# Patient Record
Sex: Male | Born: 1950
Health system: Southern US, Community
[De-identification: ages and names within clinical notes are randomized; demographics above are authoritative.]

## PROBLEM LIST (undated history)

## (undated) DIAGNOSIS — K200 Eosinophilic esophagitis: Secondary | ICD-10-CM

## (undated) DIAGNOSIS — K5790 Diverticulosis of intestine, part unspecified, without perforation or abscess without bleeding: Secondary | ICD-10-CM

## (undated) DIAGNOSIS — E119 Type 2 diabetes mellitus without complications: Secondary | ICD-10-CM

## (undated) DIAGNOSIS — K219 Gastro-esophageal reflux disease without esophagitis: Secondary | ICD-10-CM

## (undated) HISTORY — DX: Diverticulosis of intestine, part unspecified, without perforation or abscess without bleeding: K57.90

## (undated) HISTORY — PX: COLONOSCOPY: SHX174

## (undated) HISTORY — PX: JOINT REPLACEMENT: SHX530

## (undated) HISTORY — PX: ESOPHAGOGASTRODUODENOSCOPY: SHX1529

## (undated) HISTORY — DX: Gastro-esophageal reflux disease without esophagitis: K21.9

## (undated) HISTORY — DX: Eosinophilic esophagitis: K20.0

---

## 2001-05-26 ENCOUNTER — Encounter: Payer: Self-pay | Admitting: Family Medicine

## 2001-05-26 ENCOUNTER — Encounter: Admission: RE | Admit: 2001-05-26 | Discharge: 2001-05-26 | Payer: Self-pay | Admitting: Family Medicine

## 2008-07-17 ENCOUNTER — Ambulatory Visit (HOSPITAL_COMMUNITY): Admission: RE | Admit: 2008-07-17 | Discharge: 2008-07-17 | Payer: Self-pay | Admitting: Neurosurgery

## 2009-08-13 ENCOUNTER — Ambulatory Visit (HOSPITAL_BASED_OUTPATIENT_CLINIC_OR_DEPARTMENT_OTHER): Admission: RE | Admit: 2009-08-13 | Discharge: 2009-08-13 | Payer: Self-pay | Admitting: Orthopedic Surgery

## 2009-12-06 ENCOUNTER — Emergency Department (HOSPITAL_BASED_OUTPATIENT_CLINIC_OR_DEPARTMENT_OTHER): Admission: EM | Admit: 2009-12-06 | Discharge: 2009-12-06 | Payer: Self-pay | Admitting: Emergency Medicine

## 2010-05-05 ENCOUNTER — Encounter: Admission: RE | Admit: 2010-05-05 | Discharge: 2010-05-05 | Payer: Self-pay | Admitting: Occupational Medicine

## 2010-06-29 ENCOUNTER — Ambulatory Visit (HOSPITAL_COMMUNITY): Admission: RE | Admit: 2010-06-29 | Discharge: 2010-06-29 | Payer: Self-pay | Admitting: Neurosurgery

## 2010-10-04 HISTORY — PX: KNEE ARTHROSCOPY: SUR90

## 2010-10-25 ENCOUNTER — Encounter: Payer: Self-pay | Admitting: Neurosurgery

## 2010-12-28 LAB — B. BURGDORFI ANTIBODIES: B burgdorferi Ab IgG+IgM: 0.33 {ISR}

## 2011-01-06 LAB — POCT HEMOGLOBIN-HEMACUE: Hemoglobin: 16.1 g/dL (ref 13.0–17.0)

## 2011-07-12 ENCOUNTER — Ambulatory Visit (HOSPITAL_BASED_OUTPATIENT_CLINIC_OR_DEPARTMENT_OTHER)
Admission: RE | Admit: 2011-07-12 | Discharge: 2011-07-12 | Disposition: A | Discharge status: Home / Self Care | Payer: 59 | Source: Ambulatory Visit | Attending: Orthopedic Surgery | Admitting: Orthopedic Surgery

## 2011-07-12 DIAGNOSIS — M6530 Trigger finger, unspecified finger: Secondary | ICD-10-CM | POA: Insufficient documentation

## 2011-07-12 DIAGNOSIS — Z01812 Encounter for preprocedural laboratory examination: Secondary | ICD-10-CM | POA: Insufficient documentation

## 2011-07-13 LAB — POCT HEMOGLOBIN-HEMACUE: Hemoglobin: 14.8 g/dL (ref 13.0–17.0)

## 2011-07-13 NOTE — Op Note (Signed)
NAMEQUARTEZ, LAGOS NO.:  [id number]  MEDICAL RECORD NO.:  [id number]  LOCATION:                                 FACILITY:  PHYSICIAN:  Jones Broom, MD         DATE OF BIRTH:  DATE OF PROCEDURE:  07/12/2011 DATE OF DISCHARGE:                              OPERATIVE REPORT   PREOPERATIVE DIAGNOSIS:  Right ring trigger finger.  POSTOPERATIVE DIAGNOSIS:  Right ring trigger finger.  PROCEDURE PERFORMED:  Right trigger finger release (A1 pulley release.)  ATTENDING SURGEON:  Jones Broom, MD  ASSISTANT:  None.  ANESTHESIA:  GETA with local anesthesia.  COMPLICATIONS:  None.  DRAINS:  None.  SPECIMENS:  None.  ESTIMATED BLOOD LOSS:  Minimal.  TOURNIQUET TIME:  10 minutes at 250 mmHg.  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  He wished to have surgical management.  He understood the risks, benefits and alternatives of the procedure including but not limited to risk of bleeding, infection, damage to neurovascular structures.  He elected to go forward with surgery.  PROCEDURE:  The patient was identified in the preoperative holding area where I personally marked the operative site after verifying site, side, and procedure with the patient.  He was taken back to the operating room where general anesthesia was induced without complication with an LMA. The right upper extremity was prepped and draped in standard sterile fashion.  A nonsterile tourniquet was applied to the forearm.  The patient did receive 2 g IV Ancef prior to elevation of the tourniquet. After the appropriate time-out procedure, the limb was exsanguinated using an Esmarch dressing.  The tourniquet was elevated to 250 mmHg. Approximately 15-mm incision was made in oblique fashion  just distal to the distal flexion crease in line with the fourth ray.  Dissection was carried down through the subcutaneous tissue with longitudinal spreading to the flexor tendon sheath.  The nerves were retracted medial and lateral with blunt retractors.  The proximal edge of the A1 pulley was identified and the pulley was noted to be stenotic and tight.  An 11- blade was then used from proximal to distal to split the A1 pulley for the complete length of the pulley.  The underlying tendon was examined. There was slight thickening, but no discrete nodules or abnormalities. The A1 pulley was noted to be widely separated at the completion of the procedure, and the tendon moved smoothly without any triggering.  The wound was then copiously irrigated with normal saline and subsequently closed with 4-0 nylon in an interrupted fashion.  The wound was infiltrated with 10 mL of 0.5% Marcaine without epinephrine.  Sterile dressings were then applied and the tourniquet was let down for total tourniquet time of 10 minutes at 250 mmHg.  The patient was then allowed to awaken from general anesthesia, transferred to the stretcher and taken to recovery room in stable condition.  POSTOPERATIVE PLAN:  He will be discharged home today with his  family. He will follow up with me in about 10 days for suture removal.     Jones Broom, MD     JC/MEDQ  D:  07/12/2011  T:  07/13/2011  Job:  [id number]  Electronically Signed by Jones Broom  on 07/13/2011 09:30:57 AM

## 2014-05-31 ENCOUNTER — Encounter: Payer: Self-pay | Admitting: Internal Medicine

## 2014-07-30 ENCOUNTER — Telehealth: Payer: Self-pay | Admitting: *Deleted

## 2014-07-30 ENCOUNTER — Ambulatory Visit (AMBULATORY_SURGERY_CENTER): Payer: Self-pay | Admitting: *Deleted

## 2014-07-30 VITALS — Ht 71.0 in | Wt 201.2 lb

## 2014-07-30 DIAGNOSIS — Z1211 Encounter for screening for malignant neoplasm of colon: Secondary | ICD-10-CM

## 2014-07-30 NOTE — Progress Notes (Signed)
No allergies to eggs or soy. No problems with anesthesia.  Pt given Emmi instructions for colonoscopy  No oxygen use  No diet drug use  

## 2014-07-30 NOTE — Telephone Encounter (Signed)
Dr Carlean Purl: pt scheduled for directed screening colonoscopy 08/13/14.  During PV pt says that he is having problems with "food sticking when he swallows." He has had to make himself vomit to relieve this.   He denies reflux symptoms.  This happens about twice a month.  Pt is asking to have this evaluated.  He would like to keep colonoscopy appointment as scheduled and make OV with you when January schedule is available.  Is this ok with you?  Thanks, Juliann Pulse

## 2014-08-01 NOTE — Telephone Encounter (Signed)
It sounds like he needs an EGD instead of a colonoscopy Would he switch out the elective colonoscopy for the EGD and possible dilation 11/10  Cannot do both that day  I think we should investigate a problem before doing a screening  Alternatively could try to find a double slot sometime in next few weeks

## 2014-08-02 ENCOUNTER — Encounter: Payer: Self-pay | Admitting: *Deleted

## 2014-08-02 NOTE — Telephone Encounter (Signed)
Talked with pt: rescheduled for EGD/Colon Wednesday 12/2 at 3:00.  Updated prep instructions reviewed and mailed to pt.

## 2014-08-13 ENCOUNTER — Encounter: Payer: Self-pay | Admitting: Internal Medicine

## 2014-08-13 ENCOUNTER — Encounter: Payer: 59 | Admitting: Internal Medicine

## 2014-09-03 ENCOUNTER — Encounter: Payer: Self-pay | Admitting: *Deleted

## 2014-09-04 ENCOUNTER — Encounter: Payer: Self-pay | Admitting: Internal Medicine

## 2014-09-04 ENCOUNTER — Ambulatory Visit: Payer: 59 | Admitting: Family Medicine

## 2014-09-04 ENCOUNTER — Ambulatory Visit (AMBULATORY_SURGERY_CENTER): Payer: 59 | Admitting: Internal Medicine

## 2014-09-04 VITALS — BP 120/73 | HR 55 | Temp 98.0°F | Resp 17 | Ht 71.0 in | Wt 201.0 lb

## 2014-09-04 DIAGNOSIS — R1310 Dysphagia, unspecified: Secondary | ICD-10-CM

## 2014-09-04 DIAGNOSIS — Z1211 Encounter for screening for malignant neoplasm of colon: Secondary | ICD-10-CM

## 2014-09-04 MED ORDER — SODIUM CHLORIDE 0.9 % IV SOLN: 500.0000 mL | INTRAVENOUS | Status: DC

## 2014-09-04 NOTE — Op Note (Signed)
[place]  [address] [place], [address]   ENDOSCOPY PROCEDURE REPORT  PATIENT: Alex, Clark  MR#: [id number] BIRTHDATE: 1951-02-02 , 41  yrs. old GENDER: male ENDOSCOPIST: [name], MD, [facility] PROCEDURE DATE:  09/04/2014 PROCEDURE:  EGD w/ biopsy and Maloney dilation of esophagus ASA CLASS:     Class II INDICATIONS:  dysphagia. MEDICATIONS: Propofol 250 mg IV and Monitored anesthesia care TOPICAL ANESTHETIC: none  DESCRIPTION OF PROCEDURE: After the risks benefits and alternatives of the procedure were thoroughly explained, informed consent was obtained.  The LB [id number] endoscope was introduced through the mouth and advanced to the second portion of the duodenum , Without limitations.  The instrument was slowly withdrawn as the mucosa was fully examined.    1) Fissures and multiple subtle ring-like mucosa changes in entire esophagus - biopsied to see if eosinophilic esophagitis 2) otherwise normal EGD3) 54 Fr Maloney dilation completed with mild resistance and trace heme (after biopsies).  Retroflexed views revealed no abnormalities.     The scope was then withdrawn from the patient and the procedure completed.  COMPLICATIONS: There were no immediate complications.  ENDOSCOPIC IMPRESSION: 1) Fissures and multiple subtle ring-like mucosa changes in entire esophagus - biopsied to see if eosinophilic esophagitis 2) otherwise normal EGD 3) 54 Fr Maloney dilation completed with mild resistance and trace heme (after biopsies)  RECOMMENDATIONS: 1.  Clear liquids until 5 PM , then soft foods rest of day.  Resume prior diet tomorrow. 2.  Office will call with results   eSigned:  [name], MD, [facility] 09/04/2014 4:01 PM    CC: The Patient

## 2014-09-04 NOTE — Patient Instructions (Addendum)
I dilated the esophagus and took biopsies to see if we can understand the cause of your swallowing difficulty. We will call with results and plans.  No polyps in the colon. You do have a condition called diverticulosis - common and not usually a problem. Please read the handout provided.  Next routine colonoscopy in 10 years - 2025  I appreciate the opportunity to care for you. Alex Mayer, MD, [facility]  Diverticulosis (handout given) Repeat colonoscopy in 10 years.  Await biopsy results from Dr. Carlean Purl for endoscopy.  Dilation diet (handout given)  YOU HAD AN ENDOSCOPIC PROCEDURE TODAY AT [facility]: Refer to the procedure report that was given to you for any specific questions about what was found during the examination.  If the procedure report does not answer your questions, please call your gastroenterologist to clarify.  If you requested that your care partner not be given the details of your procedure findings, then the procedure report has been included in a sealed envelope for you to review at your convenience later.  YOU SHOULD EXPECT: Some feelings of bloating in the abdomen. Passage of more gas than usual.  Walking can help get rid of the air that was put into your GI tract during the procedure and reduce the bloating. If you had a lower endoscopy (such as a colonoscopy or flexible sigmoidoscopy) you may notice spotting of blood in your stool or on the toilet paper. If you underwent a bowel prep for your procedure, then you may not have a normal bowel movement for a few days.  DIET: Your first meal following the procedure should be a light meal and then it is ok to progress to your normal diet.  A half-sandwich or bowl of soup is an example of a good first meal.  Heavy or fried foods are harder to digest and may make you feel nauseous or bloated.  Likewise meals heavy in dairy and vegetables can cause extra gas to form and this can also increase the  bloating.  Drink plenty of fluids but you should avoid alcoholic beverages for 24 hours.  ACTIVITY: Your care partner should take you home directly after the procedure.  You should plan to take it easy, moving slowly for the rest of the day.  You can resume normal activity the day after the procedure however you should NOT DRIVE or use heavy machinery for 24 hours (because of the sedation medicines used during the test).    SYMPTOMS TO REPORT IMMEDIATELY: A gastroenterologist can be reached at any hour.  During normal business hours, 8:30 AM to 5:00 PM Monday through Friday, call [phone].  After hours and on weekends, please call the GI answering service at [phone] who will take a message and have the physician on call contact you.   Following lower endoscopy (colonoscopy or flexible sigmoidoscopy):  Excessive amounts of blood in the stool  Significant tenderness or worsening of abdominal pains  Swelling of the abdomen that is new, acute  Fever of 100F or higher  Following upper endoscopy (EGD)  Vomiting of blood or coffee ground material  New chest pain or pain under the shoulder blades  Painful or persistently difficult swallowing  New shortness of breath  Fever of 100F or higher  Black, tarry-looking stools  FOLLOW UP: If any biopsies were taken you will be contacted by phone or by letter within the next 1-3 weeks.  Call your gastroenterologist if you have not heard about  the biopsies in 3 weeks.  Our staff will call the home number listed on your records the next business day following your procedure to check on you and address any questions or concerns that you may have at that time regarding the information given to you following your procedure. This is a courtesy call and so if there is no answer at the home number and we have not heard from you through the emergency physician on call, we will assume that you have returned to your regular daily activities without  incident.  SIGNATURES/CONFIDENTIALITY: You and/or your care partner have signed paperwork which will be entered into your electronic medical record.  These signatures attest to the fact that that the information above on your After Visit Summary has been reviewed and is understood.  Full responsibility of the confidentiality of this discharge information lies with you and/or your care-partner.

## 2014-09-04 NOTE — Progress Notes (Signed)
Report to PACU, RN, vss, BBS= Clear.  

## 2014-09-04 NOTE — Progress Notes (Signed)
Called to room to assist during endoscopic procedure.  Patient ID and intended procedure confirmed with present staff. Received instructions for my participation in the procedure from the performing physician.  

## 2014-09-04 NOTE — Op Note (Signed)
[place]  [address]. [place], [address]   COLONOSCOPY PROCEDURE REPORT  PATIENT: Alex Clark, Alex Clark  MR#: [id number] BIRTHDATE: 01/31/1951 , 67  yrs. old GENDER: male ENDOSCOPIST: [name], MD, [facility] PROCEDURE DATE:  09/04/2014 PROCEDURE:   Colonoscopy, screening First Screening Colonoscopy - Avg.  risk and is 50 yrs.  old or older Yes.  Prior Negative Screening - Now for repeat screening. N/A  History of Adenoma - Now for follow-up colonoscopy & has been > or = to 3 yrs.  N/A  Polyps Removed Today? No.  Polyps Removed Today? No.  Recommend repeat exam, <10 yrs? Polyps Removed Today? No.  Recommend repeat exam, <10 yrs? No. ASA CLASS:   Class II INDICATIONS:average risk for colorectal cancer and first colonoscopy. MEDICATIONS: Residual sedation present, Propofol 50 mg IV, and Monitored anesthesia care  DESCRIPTION OF PROCEDURE:   After the risks benefits and alternatives of the procedure were thoroughly explained, informed consent was obtained.  The digital rectal exam revealed no abnormalities of the rectum, revealed no prostatic nodules, and revealed the prostate was not enlarged.   The LB [id number] endoscope was introduced through the anus and advanced to the cecum, which was identified by both the appendix and ileocecal valve. No adverse events experienced.   The quality of the prep was good, using MiraLax  The instrument was then slowly withdrawn as the colon was fully examined.      COLON FINDINGS: There was mild diverticulosis noted in the sigmoid colon.   The examination was otherwise normal.  Retroflexed views revealed no abnormalities. The time to cecum=3 minutes 09 seconds. Withdrawal time=9 minutes 34 seconds.  The scope was withdrawn and the procedure completed. COMPLICATIONS: There were no immediate complications.  ENDOSCOPIC IMPRESSION: 1.   Mild diverticulosis was noted in the sigmoid colon 2.   The examination was  otherwise normal - good prep - first screening  RECOMMENDATIONS: Repeat colonoscopy 10 years.  eSigned:  [name], MD, [facility] 09/04/2014 4:04 PM   cc: The Patient

## 2014-09-05 ENCOUNTER — Telehealth: Payer: Self-pay | Admitting: *Deleted

## 2014-09-05 NOTE — Telephone Encounter (Signed)
  Follow up Call-  Call back number 09/04/2014  Post procedure Call Back phone  # [phone]  Permission to leave phone message Yes     Patient questions:  Message left to call us if necessary.

## 2014-09-09 NOTE — Progress Notes (Signed)
Quick Note:  Office - let him know he has eosinophilic esophagitis and ask how he is doing after the dilation I want him to start pantoprazole 40 mg bid before breakfast and supper # 60 3 RF Needs REV Feb ______

## 2014-09-10 ENCOUNTER — Other Ambulatory Visit: Payer: Self-pay

## 2014-09-10 MED ORDER — PANTOPRAZOLE SODIUM 40 MG PO TBEC: 40.0000 mg | DELAYED_RELEASE_TABLET | Freq: Two times a day (BID) | ORAL | Status: DC

## 2014-09-19 ENCOUNTER — Encounter: Payer: Self-pay | Admitting: Family Medicine

## 2014-09-19 ENCOUNTER — Ambulatory Visit (INDEPENDENT_AMBULATORY_CARE_PROVIDER_SITE_OTHER): Payer: 59 | Admitting: Family Medicine

## 2014-09-19 VITALS — BP 112/60 | HR 68 | Temp 98.2°F | Ht 68.75 in | Wt 202.8 lb

## 2014-09-19 DIAGNOSIS — M25552 Pain in left hip: Secondary | ICD-10-CM

## 2014-09-19 DIAGNOSIS — M25551 Pain in right hip: Secondary | ICD-10-CM | POA: Insufficient documentation

## 2014-09-19 DIAGNOSIS — K219 Gastro-esophageal reflux disease without esophagitis: Secondary | ICD-10-CM

## 2014-09-19 NOTE — Assessment & Plan Note (Signed)
With evidence of eosinophilic esophagitis. On PPI Followed by Dr. Carlean Purl- advised him to call GI to discuss headaches. The patient indicates understanding of these issues and agrees with the plan.

## 2014-09-19 NOTE — Patient Instructions (Addendum)
It was nice to meet you. I will review your records and call you if we need to add anything.  Check with your insurance to see if they will cover the shingles shot.  Call Dr. Carlean Purl about your headaches- [phone] ? If it is due to protonix.

## 2014-09-19 NOTE — Assessment & Plan Note (Signed)
Intermittent. Normal exam today. Probable OA and intermittent bursitis. Advised to use NSAIDs cautiously and tylenol prn. The patient indicates understanding of these issues and agrees with the plan.

## 2014-09-19 NOTE — Progress Notes (Signed)
Subjective:   Patient ID: Alex Clark, male    DOB: 04/14/1951, 63 y.o.   MRN: [id number]  Alex Clark is a pleasant 63 y.o. year old male who presents to clinic today with [place]  on 09/19/2014  HPI:  Very healthy- does not take many medications.  Previous PCP referred him to GI for dysphagia- saw Dr. Carlean Purl.  Endoscopy and Colonoscopy done on 09/04/14- neg colonoscopy.  Endoscopy with esophageal dilatation- biopsy showed eosinophilic esophagitis- started on Protonix 40 mg twice daily.  He has never had any "classic" symptoms of reflux. He thinks it is giving him headaches.  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  Per pt, just had lab work done- will await records.  Current Outpatient Prescriptions on File Prior to Visit  Medication Sig Dispense Refill  . ibuprofen (ADVIL) 200 MG tablet Take 200 mg by mouth every 6 (six) hours as needed.    . pantoprazole (PROTONIX) 40 MG tablet Take 1 tablet (40 mg total) by mouth 2 (two) times daily before a meal. 60 tablet 6   No current facility-administered medications on file prior to visit.    No Known Allergies  History reviewed. No pertinent past medical history.  Past Surgical History  Procedure Laterality Date  . Knee arthroscopy Left 2012    Family History  Problem Relation Age of Onset  . Colon cancer Neg Hx   . Heart disease Mother   . Hypertension Mother   . Hypertension Father   . Cancer Sister   . Cancer Maternal Uncle     History   Social History  . Marital Status: Single    Spouse Name: N/A    Number of Children: N/A  . Years of Education: N/A   Occupational History  . Not on file.   Social History Main Topics  . Smoking status: Never Smoker   . Smokeless tobacco: Never Used  . Alcohol Use: No  . Drug Use: No  . Sexual Activity: Yes   Other Topics Concern  . Not  on file   Social History Narrative   Archivist   Divorced- two children- son lives in [place], [place] an daughter lives in [place]- she has two sons.      The PMH, PSH, Social History, Family History, Medications, and allergies have been reviewed in [facility], and have been updated if relevant.   Review of Systems  Constitutional: Negative.   Respiratory: Negative.   Cardiovascular: Negative.   Gastrointestinal: Negative.   Endocrine: Negative.   Genitourinary: Negative.   Musculoskeletal: Negative for back pain, joint swelling and gait problem.  Allergic/Immunologic: Negative.   Neurological: Positive for headaches. Negative for dizziness and numbness.  Hematological: Negative.   Psychiatric/Behavioral: Negative.   All other systems reviewed and are negative.      Objective:    BP 112/60 mmHg  Pulse 68  Temp(Src) 98.2 F (36.8 C) (Oral)  Ht 5' 8.75" (1.746 m)  Wt 202 lb 12 oz (91.967 kg)  BMI 30.17 kg/m2  SpO2 96%   Physical Exam  Constitutional: He is oriented to person, place, and time. He appears well-developed and well-nourished. No distress.  HENT:  Head: Normocephalic and atraumatic.  Eyes: Pupils are equal, round, and reactive to light.  Neck: Normal range of motion.  Cardiovascular: Normal rate and regular rhythm.  Pulmonary/Chest: Effort normal and breath sounds normal.  Musculoskeletal: Normal range of motion.       Right hip: Normal.       Left hip: Normal.  Neurological: He is alert and oriented to person, place, and time.  Skin: Skin is warm and dry.  Psychiatric: He has a normal mood and affect. His behavior is normal. Judgment and thought content normal.  Nursing note and vitals reviewed.         Assessment & Plan:   Gastroesophageal reflux disease, esophagitis presence not specified  Hip pain, bilateral No Follow-up on file.

## 2014-09-19 NOTE — Progress Notes (Signed)
Pre visit review using our clinic review tool, if applicable. No additional management support is needed unless otherwise documented below in the visit note. 

## 2014-11-08 ENCOUNTER — Telehealth: Payer: Self-pay | Admitting: Internal Medicine

## 2014-11-08 NOTE — Telephone Encounter (Signed)
Patient notified of follow up for EE

## 2014-11-12 ENCOUNTER — Ambulatory Visit (INDEPENDENT_AMBULATORY_CARE_PROVIDER_SITE_OTHER): Payer: 59 | Admitting: Internal Medicine

## 2014-11-12 ENCOUNTER — Encounter: Payer: Self-pay | Admitting: Internal Medicine

## 2014-11-12 VITALS — BP 110/80 | HR 60 | Ht 68.75 in | Wt 211.2 lb

## 2014-11-12 DIAGNOSIS — K2100 Gastro-esophageal reflux disease with esophagitis, without bleeding: Secondary | ICD-10-CM

## 2014-11-12 DIAGNOSIS — G4441 Drug-induced headache, not elsewhere classified, intractable: Secondary | ICD-10-CM

## 2014-11-12 DIAGNOSIS — K200 Eosinophilic esophagitis: Secondary | ICD-10-CM

## 2014-11-12 DIAGNOSIS — G4440 Drug-induced headache, not elsewhere classified, not intractable: Secondary | ICD-10-CM

## 2014-11-12 MED ORDER — OMEPRAZOLE 40 MG PO CPDR: 40.0000 mg | DELAYED_RELEASE_CAPSULE | Freq: Every day | ORAL | Status: DC

## 2014-11-12 NOTE — Progress Notes (Signed)
   Subjective:    Patient ID: Alex Clark, male    DOB: March 17, 1951, 64 y.o.   MRN: [id number] Chief complaint: Follow-up of eosinophilic esophagitis HPI  The patient is here S after he was diagnosed with eosinophilic esophagitis at upper endoscopy in December. At that time I performed a 73 [place] dilation as well. Since then he is been treated with pantoprazole 40 mg twice a day. He has had no dysphagia since that time. He thinks he was having swallowing problems for 20 or more years. He has noticed a headache ever since starting the pantoprazole, almost every day. He recalls taking omeprazole or Prilosec many years ago for heartburn problems though was not chronic. That did not cause headaches. Medications, allergies, past medical history, past surgical history, family history and social history are reviewed and updated in the EMR.  Review of Systems As per history of present illness    Objective:   Physical Exam BP 110/80 mmHg  Pulse 60  Ht 5' 8.75" (1.746 m)  Wt 211 lb 3.2 oz (95.8 kg)  BMI 31.43 kg/m2 Well-developed well-nourished white man in no acute distress    Assessment & Plan:   1. Eosinophilic esophagitis   2. Gastroesophageal reflux disease with esophagitis   3. Drug induced headache    1. I reviewed the nature of eosinophilic esophagitis and the possibility of food allergies. A handout was provided to the patient. At this point we have decided to proceed with continued PPI therapy and follow-up in 1 year sooner if needed. 2. Changes PPI to omeprazole 40 mg daily, as we think pantoprazole caused headache.

## 2014-11-12 NOTE — Patient Instructions (Signed)
   Glad things are better. I hope the omeprazole does not cause a headache.  Let me know if swallowing problems return.  I appreciate the opportunity to care for you. [name], MD, [facility]

## 2016-03-15 ENCOUNTER — Ambulatory Visit (INDEPENDENT_AMBULATORY_CARE_PROVIDER_SITE_OTHER): Payer: Commercial Managed Care - HMO | Admitting: Adult Health

## 2016-03-15 ENCOUNTER — Ambulatory Visit: Payer: 59 | Admitting: Internal Medicine

## 2016-03-15 ENCOUNTER — Encounter: Payer: Self-pay | Admitting: Adult Health

## 2016-03-15 VITALS — BP 120/82 | Temp 98.2°F | Wt 204.9 lb

## 2016-03-15 DIAGNOSIS — L259 Unspecified contact dermatitis, unspecified cause: Secondary | ICD-10-CM | POA: Diagnosis not present

## 2016-03-15 MED ORDER — METHYLPREDNISOLONE ACETATE 80 MG/ML IJ SUSP
80.0000 mg | Freq: Once | INTRAMUSCULAR | Status: AC
  Administered 2016-03-15: 80 mg via INTRAMUSCULAR

## 2016-03-15 NOTE — Patient Instructions (Addendum)
It was great meeting you   Your rash is consistent with poison ivy.   Continue to use the calamine lotion as needed for itching.   You can also use oral benadryl at night or a benadryl cream  Follow up as needed

## 2016-03-15 NOTE — Progress Notes (Signed)
Subjective:    Patient ID: Alex Clark, male    DOB: 07-23-1951, 65 y.o.   MRN: [id number]  HPI 65 year old male who presents to the office today for a " poison ivy " type rash. He reports that he was working in the yard on Thursday when he became in contact with poison ivy.   He reports that he has a red itchy rash that has not started weeping yet. The rash is on bilateral legs. Denies rash elsewhere. Denies any signs of infection.      Review of Systems  Constitutional: Negative.   HENT: Negative.   Skin: Positive for color change and rash. Negative for pallor and wound.  All other systems reviewed and are negative.  Past Medical History  Diagnosis Date  . Diverticulosis   . Eosinophilic esophagitis   . GERD (gastroesophageal reflux disease)     Social History   Social History  . Marital Status: Single    Spouse Name: N/A  . Number of Children: N/A  . Years of Education: N/A   Occupational History  . Not on file.   Social History Main Topics  . Smoking status: Never Smoker   . Smokeless tobacco: Never Used  . Alcohol Use: No  . Drug Use: No  . Sexual Activity: Yes   Other Topics Concern  . 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       Past Surgical History  Procedure Laterality Date  . Knee arthroscopy Left 2012  . Colonoscopy    . Esophagogastroduodenoscopy      Family History  Problem Relation Age of Onset  . Colon cancer Neg Hx   . Heart disease Mother   . Hypertension Mother   . Hypertension Father   . Cancer Sister   . Cancer Maternal Uncle     Allergies  Allergen Reactions  . Pantoprazole Sodium Other (See Comments)    Headache    Current Outpatient Prescriptions on File Prior to Visit  Medication Sig Dispense Refill  . ibuprofen (ADVIL) 200 MG tablet Take 200 mg by mouth every 6 (six) hours as needed.    Marland Kitchen omeprazole  (PRILOSEC) 40 MG capsule Take 1 capsule (40 mg total) by mouth daily before breakfast. 90 capsule 3   No current facility-administered medications on file prior to visit.    BP 120/82 mmHg  Temp(Src) 98.2 F (36.8 C) (Oral)  Wt 204 lb 14.4 oz (92.942 kg)       Objective:   Physical Exam  Constitutional: He is oriented to person, place, and time. He appears well-developed and well-nourished. No distress.  Cardiovascular: Normal rate, regular rhythm, normal heart sounds and intact distal pulses.  Exam reveals no gallop and no friction rub.   No murmur heard. Pulmonary/Chest: Effort normal and breath sounds normal.  Musculoskeletal: Normal range of motion. He exhibits no edema or tenderness.  Neurological: He is alert and oriented to person, place, and time.  Skin: Skin is warm and dry. Rash noted. He is not diaphoretic. There is erythema. No pallor.  Red, vascular rash that is consistent with poison ivy on bilateral legs.   Psychiatric: He has a normal mood and affect. His behavior is normal. Judgment and thought content normal.      Assessment & Plan:  1. Contact dermatitis - methylPREDNISolone acetate (DEPO-MEDROL) injection  80 mg; Inject 1 mL (80 mg total) into the muscle once. - Calamine lotion - oral benadryl  - Follow up as needed  Dorothyann Peng, NP

## 2016-03-17 ENCOUNTER — Ambulatory Visit (INDEPENDENT_AMBULATORY_CARE_PROVIDER_SITE_OTHER): Payer: Commercial Managed Care - HMO | Admitting: Family Medicine

## 2016-03-17 ENCOUNTER — Encounter: Payer: Self-pay | Admitting: Family Medicine

## 2016-03-17 VITALS — BP 114/76 | HR 60 | Temp 98.7°F | Ht 68.75 in | Wt 205.2 lb

## 2016-03-17 DIAGNOSIS — L237 Allergic contact dermatitis due to plants, except food: Secondary | ICD-10-CM

## 2016-03-17 MED ORDER — OMEPRAZOLE 40 MG PO CPDR: 40.0000 mg | DELAYED_RELEASE_CAPSULE | Freq: Every day | ORAL | Status: DC

## 2016-03-17 MED ORDER — PREDNISONE 20 MG PO TABS: ORAL_TABLET | ORAL | Status: DC

## 2016-03-17 NOTE — Patient Instructions (Signed)
Prednisone taper. 3-3-3-3-2-2-2-2-1-1-1-1.  With food.  Repeat if needed.  Schedule a physical with Dr. Deborra Medina.  Take care.  Glad to see you.

## 2016-03-17 NOTE — Assessment & Plan Note (Signed)
Doesn't appear infected.  Steroid taper with routine cautions.  F/u prn.   Can repeat the taper if needed.  D/w pt.  He agrees.

## 2016-03-17 NOTE — Progress Notes (Signed)
Pre visit review using our clinic review tool, if applicable. No additional management support is needed unless otherwise documented below in the visit note.  Needs a PPI refill, done at [facility] with the plan for him to fu with PCP re CPE.   Poison oak exposure likely.  Itchy.  Using calamine lotion.  Used had to get a shot and a dose pack.  Injected Monday.  Still itches.  Spreading.  BLE rash.  No FCNAVD.    Meds, vitals, and allergies reviewed.   ROS: Per HPI unless specifically indicated in ROS section   nad Blanching irregular rash on the BLE.

## 2016-04-01 ENCOUNTER — Encounter: Payer: Commercial Managed Care - HMO | Admitting: Family Medicine

## 2016-04-05 ENCOUNTER — Encounter: Payer: Self-pay | Admitting: Family Medicine

## 2016-04-05 ENCOUNTER — Ambulatory Visit (INDEPENDENT_AMBULATORY_CARE_PROVIDER_SITE_OTHER): Payer: Commercial Managed Care - HMO | Admitting: Family Medicine

## 2016-04-05 VITALS — BP 114/78 | HR 55 | Temp 98.2°F | Ht 68.75 in | Wt 193.5 lb

## 2016-04-05 DIAGNOSIS — K2100 Gastro-esophageal reflux disease with esophagitis, without bleeding: Secondary | ICD-10-CM

## 2016-04-05 DIAGNOSIS — N528 Other male erectile dysfunction: Secondary | ICD-10-CM

## 2016-04-05 DIAGNOSIS — Z114 Encounter for screening for human immunodeficiency virus [HIV]: Secondary | ICD-10-CM

## 2016-04-05 DIAGNOSIS — R7989 Other specified abnormal findings of blood chemistry: Secondary | ICD-10-CM

## 2016-04-05 DIAGNOSIS — Z23 Encounter for immunization: Secondary | ICD-10-CM

## 2016-04-05 DIAGNOSIS — N529 Male erectile dysfunction, unspecified: Secondary | ICD-10-CM | POA: Insufficient documentation

## 2016-04-05 DIAGNOSIS — K200 Eosinophilic esophagitis: Secondary | ICD-10-CM

## 2016-04-05 DIAGNOSIS — Z1159 Encounter for screening for other viral diseases: Secondary | ICD-10-CM

## 2016-04-05 DIAGNOSIS — Z0000 Encounter for general adult medical examination without abnormal findings: Secondary | ICD-10-CM | POA: Insufficient documentation

## 2016-04-05 LAB — COMPREHENSIVE METABOLIC PANEL
ALBUMIN: 4.5 g/dL (ref 3.5–5.2)
ALT: 27 U/L (ref 0–53)
AST: 17 U/L (ref 0–37)
Alkaline Phosphatase: 62 U/L (ref 39–117)
BUN: 14 mg/dL (ref 6–23)
CHLORIDE: 103 meq/L (ref 96–112)
CO2: 30 mEq/L (ref 19–32)
CREATININE: 1.33 mg/dL (ref 0.40–1.50)
Calcium: 9.8 mg/dL (ref 8.4–10.5)
GFR: 57.81 mL/min — ABNORMAL LOW (ref >60.00)
GLUCOSE: 119 mg/dL — AB (ref 70–99)
Potassium: 5.3 mEq/L — ABNORMAL HIGH (ref 3.5–5.1)
SODIUM: 137 meq/L (ref 135–145)
Total Bilirubin: 0.9 mg/dL (ref 0.2–1.2)
Total Protein: 7.5 g/dL (ref 6.0–8.3)

## 2016-04-05 LAB — CBC WITH DIFFERENTIAL/PLATELET
BASOS PCT: 0.3 % (ref 0.0–3.0)
Basophils Absolute: 0 10*3/uL (ref 0.0–0.1)
EOS ABS: 0.2 10*3/uL (ref 0.0–0.7)
Eosinophils Relative: 1.6 % (ref 0.0–5.0)
HCT: 48.7 % (ref 39.0–52.0)
HEMOGLOBIN: 16.5 g/dL (ref 13.0–17.0)
Lymphocytes Relative: 20 % (ref 12.0–46.0)
Lymphs Abs: 2.4 10*3/uL (ref 0.7–4.0)
MCHC: 33.8 g/dL (ref 30.0–36.0)
MCV: 87.1 fl (ref 78.0–100.0)
MONO ABS: 0.9 10*3/uL (ref 0.1–1.0)
MONOS PCT: 8 % (ref 3.0–12.0)
Neutro Abs: 8.3 10*3/uL — ABNORMAL HIGH (ref 1.4–7.7)
Neutrophils Relative %: 70.1 % (ref 43.0–77.0)
Platelets: 214 10*3/uL (ref 150.0–400.0)
RBC: 5.59 Mil/uL (ref 4.22–5.81)
RDW: 13.9 % (ref 11.5–15.5)
WBC: 11.8 10*3/uL — ABNORMAL HIGH (ref 4.0–10.5)

## 2016-04-05 LAB — LIPID PANEL
CHOL/HDL RATIO: 5
CHOLESTEROL: 230 mg/dL — AB (ref 0–200)
HDL: 46.1 mg/dL (ref >39.00)
NONHDL: 184.14
Triglycerides: 217 mg/dL — ABNORMAL HIGH (ref 0.0–149.0)
VLDL: 43.4 mg/dL — ABNORMAL HIGH (ref 0.0–40.0)

## 2016-04-05 LAB — LDL CHOLESTEROL, DIRECT: LDL DIRECT: 166 mg/dL

## 2016-04-05 LAB — PSA: PSA: 1.68 ng/mL (ref 0.10–4.00)

## 2016-04-05 MED ORDER — SILDENAFIL CITRATE 25 MG PO TABS: 25.0000 mg | ORAL_TABLET | Freq: Every day | ORAL | Status: DC | PRN

## 2016-04-05 NOTE — Patient Instructions (Signed)
Great to see you. We will call you with your results from today. 

## 2016-04-05 NOTE — Assessment & Plan Note (Signed)
Discussed possible risks and side effects of viagra. Rx printed and given to pt.

## 2016-04-05 NOTE — Assessment & Plan Note (Signed)
Continue omeprazole. Followed by GI.

## 2016-04-05 NOTE — Addendum Note (Signed)
Addended by: Modena Nunnery on: 04/05/2016 11:25 AM   Modules accepted: Orders, SmartSet

## 2016-04-05 NOTE — Progress Notes (Signed)
Subjective:   Patient ID: Alex Clark, male    DOB: 11/11/1950, 65 y.o.   MRN: [id number]  Alex Clark is a pleasant 65 y.o. year old male who presents to clinic today with Annual Exam  on 04/05/2016  HPI:  Very healthy. I have not seen him since he established care with me in 09/2014.  GERD- Previous PCP referred him to GI for dysphagia- saw Dr. Carlean Purl.  Endoscopy and Colonoscopy done on 09/04/14- neg colonoscopy.  Endoscopy with esophageal dilatation- biopsy showed eosinophilic esophagitis- started on Protonix 40 mg twice daily.  He has never had any "classic" symptoms of reflux.  Saw Dr. Carlean Purl again on 11/12/14.  Note reviewed. Protonix dc'd as may have been contributing to headaches and started on Omeprazole 40 mg daily.  Has had no further headaches.  He is asking for viagra.  Used this in past.  Often has problems with "stamina" but is able to get an erection without difficulty.  No recent changes in sexual function. No results found for: CHOL, HDL, LDLCALC, LDLDIRECT, TRIG, CHOLHDL No results found for: CREATININE Lab Results  Component Value Date   HGB 14.8 07/12/2011   No results found for: TSH   Current Outpatient Prescriptions on File Prior to Visit  Medication Sig Dispense Refill  . ibuprofen (ADVIL) 200 MG tablet Take 200 mg by mouth every 6 (six) hours as needed.    Marland Kitchen omeprazole (PRILOSEC) 40 MG capsule Take 1 capsule (40 mg total) by mouth daily before breakfast. 90 capsule 0   No current facility-administered medications on file prior to visit.    Allergies  Allergen Reactions  . Pantoprazole Sodium Other (See Comments)    Headache    Past Medical History  Diagnosis Date  . Diverticulosis   . Eosinophilic esophagitis   . GERD (gastroesophageal reflux disease)     Past Surgical History  Procedure Laterality Date  . Knee arthroscopy Left 2012  . Colonoscopy    . Esophagogastroduodenoscopy      Family History  Problem Relation Age of Onset    . Colon cancer Neg Hx   . Heart disease Mother   . Hypertension Mother   . Hypertension Father   . Cancer Sister   . Cancer Maternal Uncle     Social History   Social History  . Marital Status: Single    Spouse Name: N/A  . Number of Children: N/A  . Years of Education: N/A   Occupational History  . Not on file.   Social History Main Topics  . Smoking status: Never Smoker   . Smokeless tobacco: Never Used  . Alcohol Use: No  . Drug Use: No  . Sexual Activity: Yes   Other Topics Concern  . 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      The PMH, PSH, Social History, Family History, Medications, and allergies have been reviewed in [facility], and have been updated if relevant.   Review of Systems  Constitutional: Negative.   Respiratory: Negative.   Cardiovascular: Negative.   Gastrointestinal: Negative.   Endocrine: Negative.   Genitourinary: Negative.   Musculoskeletal: Negative for back pain, joint swelling and gait problem.  Skin: Negative.   Allergic/Immunologic: Negative.   Neurological: Negative for dizziness, numbness and headaches.  Hematological: Negative.   Psychiatric/Behavioral: Negative.   All other systems reviewed and  are negative.      Objective:    BP 114/78 mmHg  Pulse 55  Temp(Src) 98.2 F (36.8 C) (Oral)  Ht 5' 8.75" (1.746 m)  Wt 193 lb 8 oz (87.771 kg)  BMI 28.79 kg/m2  SpO2 96%   Physical Exam  Constitutional: He is oriented to person, place, and time. He appears well-developed and well-nourished. No distress.  HENT:  Head: Normocephalic and atraumatic.  Eyes: Pupils are equal, round, and reactive to light.  Neck: Normal range of motion.  Cardiovascular: Normal rate and regular rhythm.   Pulmonary/Chest: Effort normal and breath sounds normal.  Abdominal: Soft. Bowel sounds are normal.  Musculoskeletal: Normal range of  motion.       Right hip: Normal.       Left hip: Normal.  Neurological: He is alert and oriented to person, place, and time. He has normal reflexes. No cranial nerve deficit. Coordination normal.  Skin: Skin is warm and dry.  Psychiatric: He has a normal mood and affect. His behavior is normal. Judgment and thought content normal.  Nursing note and vitals reviewed.         Assessment & Plan:   Visit for well man health check  Gastroesophageal reflux disease with esophagitis  Eosinophilic esophagitis No Follow-up on file.

## 2016-04-05 NOTE — Assessment & Plan Note (Signed)
Reviewed preventive care protocols, scheduled due services, and updated immunizations Discussed nutrition, exercise, diet, and healthy lifestyle.  Zostavax given today.  Orders Placed This Encounter  Procedures  . CBC with Differential/Platelet  . Comprehensive metabolic panel  . Lipid panel  . PSA  . HIV antibody (with reflex)  . Hepatitis C Antibody

## 2016-04-05 NOTE — Progress Notes (Signed)
Pre visit review using our clinic review tool, if applicable. No additional management support is needed unless otherwise documented below in the visit note. 

## 2016-04-06 LAB — HEPATITIS C ANTIBODY: HCV AB: NEGATIVE

## 2016-04-06 LAB — HIV ANTIBODY (ROUTINE TESTING W REFLEX): HIV 1&2 Ab, 4th Generation: NONREACTIVE

## 2016-04-07 ENCOUNTER — Encounter: Payer: Self-pay | Admitting: *Deleted

## 2016-07-22 ENCOUNTER — Other Ambulatory Visit: Payer: Self-pay | Admitting: Family Medicine

## 2016-10-12 ENCOUNTER — Ambulatory Visit (INDEPENDENT_AMBULATORY_CARE_PROVIDER_SITE_OTHER): Payer: Commercial Managed Care - HMO | Admitting: Internal Medicine

## 2016-10-12 ENCOUNTER — Encounter: Payer: Self-pay | Admitting: Internal Medicine

## 2016-10-12 VITALS — BP 116/74 | HR 71 | Temp 98.1°F | Wt 207.0 lb

## 2016-10-12 DIAGNOSIS — J329 Chronic sinusitis, unspecified: Secondary | ICD-10-CM | POA: Diagnosis not present

## 2016-10-12 DIAGNOSIS — B9789 Other viral agents as the cause of diseases classified elsewhere: Secondary | ICD-10-CM | POA: Diagnosis not present

## 2016-10-12 MED ORDER — HYDROCODONE-HOMATROPINE 5-1.5 MG/5ML PO SYRP: 5.0000 mL | ORAL_SOLUTION | Freq: Three times a day (TID) | ORAL | 0 refills | Status: DC | PRN

## 2016-10-12 NOTE — Progress Notes (Signed)
HPI  Pt presents to the clinic today with c/o nasal congestion, sore throat and cough. This started 1 week ago. He is blowing blood tinged, clear nasal mucous out of his nose. He denies difficulty swallowing. The cough is productive of yellow mucous. He denies fever, chills or body aches. He has tried Tylenol Cold and Sinus, and Ibuprofen with minimal relief. He has no history of allergies or breathing problems. He has not had sick contacts that he is aware of.  Review of Systems     Past Medical History:  Diagnosis Date  . Diverticulosis   . Eosinophilic esophagitis   . GERD (gastroesophageal reflux disease)     Family History  Problem Relation Age of Onset  . Colon cancer Neg Hx   . Heart disease Mother   . Hypertension Mother   . Hypertension Father   . Cancer Sister   . Cancer Maternal Uncle     Social History   Social History  . Marital status: Single    Spouse name: N/A  . Number of children: N/A  . Years of education: N/A   Occupational History  . Not on file.   Social History Main Topics  . Smoking status: Never Smoker  . Smokeless tobacco: Never Used  . Alcohol use No  . Drug use: No  . Sexual activity: Yes   Other Topics Concern  . 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       Allergies  Allergen Reactions  . Pantoprazole Sodium Other (See Comments)    Headache     Constitutional: Denies headache, fatigue, fever or abrupt weight changes.  HEENT:  Positive nasal congestion and sore throat. Denies eye redness, ear pain, ringing in the ears, wax buildup, runny nose or bloody nose. Respiratory: Positive cough. Denies difficulty breathing or shortness of breath.  Cardiovascular: Denies chest pain, chest tightness, palpitations or swelling in the hands or feet.   No other specific complaints in a complete review of systems (except as listed in  HPI above).  Objective:   BP 116/74   Pulse 71   Temp 98.1 F (36.7 C) (Oral)   Wt 207 lb (93.9 kg)   SpO2 98%   BMI 30.79 kg/m   General: Appears his stated age,  in NAD. HEENT: Head: normal shape and size, no sinus tenderness noted; Eyes: sclera white, no icterus, conjunctiva pink; Right Ear: cerumen impaction; Left Ear: Tm's gray and intact, normal light reflex; Nose: mucosa boggy and moist, septum midline; Throat/Mouth: + PND. Teeth present, mucosa erythematous and moist, no exudate noted, no lesions or ulcerations noted.  Neck:  No adenopathy noted.  Cardiovascular: Normal rate and rhythm.  Pulmonary/Chest: Normal effort and positive vesicular breath sounds. No respiratory distress. No wheezes, rales or ronchi noted.       Assessment & Plan:   Viral sinusitis  Can use a Neti Pot which can be purchased from your local drug store. Flonase 2 sprays each nostril for 3 days and then as needed. Start Allegra OTC 80 mg Depo IM today RX for Hycodan for cough  RTC as needed or if symptoms persist. Webb Silversmith, NP

## 2016-10-12 NOTE — Patient Instructions (Signed)

## 2017-02-22 ENCOUNTER — Other Ambulatory Visit: Payer: Self-pay | Admitting: Family Medicine

## 2017-02-22 NOTE — Telephone Encounter (Signed)
Here's another one

## 2017-04-13 ENCOUNTER — Encounter: Payer: Self-pay | Admitting: Family Medicine

## 2017-04-13 ENCOUNTER — Ambulatory Visit (INDEPENDENT_AMBULATORY_CARE_PROVIDER_SITE_OTHER)
Admission: RE | Admit: 2017-04-13 | Discharge: 2017-04-13 | Disposition: A | Discharge status: Home / Self Care | Payer: 59 | Source: Ambulatory Visit | Attending: Family Medicine | Admitting: Family Medicine

## 2017-04-13 ENCOUNTER — Ambulatory Visit (INDEPENDENT_AMBULATORY_CARE_PROVIDER_SITE_OTHER): Payer: 59 | Admitting: Family Medicine

## 2017-04-13 VITALS — BP 120/80 | HR 57 | Ht 68.75 in | Wt 202.0 lb

## 2017-04-13 DIAGNOSIS — M25551 Pain in right hip: Secondary | ICD-10-CM

## 2017-04-13 NOTE — Progress Notes (Signed)
Subjective:    Alex Clark is a 66 y.o. male who presents with bilateral hip pain, right greater than left. Onset of the symptoms was several months ago. Inciting event: none. The patient reports the hip pain is worse after period of inactivity. Aggravating symptoms include: inactivity and squatting. Patient has had prior hip problems. Previous visits for this problem: none. Evaluation to date: none. Treatment to date: NSAIDS only- have helped some..  The following portions of the patient's history were reviewed and updated as appropriate: allergies, current medications, past family history, past medical history, past social history, past surgical history and problem list.   Current Outpatient Prescriptions on File Prior to Visit  Medication Sig Dispense Refill  . HYDROcodone-homatropine (HYCODAN) 5-1.5 MG/5ML syrup Take 5 mLs by mouth every 8 (eight) hours as needed for cough. 120 mL 0  . ibuprofen (ADVIL) 200 MG tablet Take 200 mg by mouth every 6 (six) hours as needed.    Marland Kitchen omeprazole (PRILOSEC) 40 MG capsule TAKE 1 CAPSULE BY MOUTH DAILY BEFORE BREAKFAST. 90 capsule 0  . sildenafil (VIAGRA) 25 MG tablet Take 1 tablet (25 mg total) by mouth daily as needed for erectile dysfunction. 10 tablet 0   No current facility-administered medications on file prior to visit.     Allergies  Allergen Reactions  . Pantoprazole Sodium Other (See Comments)    Headache    Past Medical History:  Diagnosis Date  . Diverticulosis   . Eosinophilic esophagitis   . GERD (gastroesophageal reflux disease)     Past Surgical History:  Procedure Laterality Date  . COLONOSCOPY    . ESOPHAGOGASTRODUODENOSCOPY    . KNEE ARTHROSCOPY Left 2012    Family History  Problem Relation Age of Onset  . Heart disease Mother   . Hypertension Mother   . Hypertension Father   . Cancer Sister   . Cancer Maternal Uncle   . Colon cancer Neg Hx     Social History   Social History  . Marital status: Single   Spouse name: N/A  . Number of children: N/A  . Years of education: N/A   Occupational History  . Not on file.   Social History Main Topics  . Smoking status: Never Smoker  . Smokeless tobacco: Never Used  . Alcohol use No  . Drug use: No  . Sexual activity: Yes   Other Topics Concern  . 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      The PMH, PSH, Social History, Family History, Medications, and allergies have been reviewed in [facility], and have been updated if relevant.  Review of Systems Pertinent items are noted in HPI.   Objective:    Ht 5' 8.75" (1.746 m)   Wt 202 lb (91.6 kg)   BMI 30.05 kg/m  Right hip: positives: decreased ROM, pain with flexion and pain with rotation  Left hip: full painless range of motion, without tenderness   Imaging: X-ray the right hip: ordered, but results not yet available    Assessment:    suspect DJD    Plan:    X-rays per orders. scheduled Tylenol.

## 2017-04-13 NOTE — Patient Instructions (Signed)
Great to see you.  Start Tylenol ES twice daily scheduled.  I will call you with your xray results.

## 2017-04-13 NOTE — Progress Notes (Signed)
Pre visit review using our clinic review tool, if applicable. No additional management support is needed unless otherwise documented below in the visit note. 

## 2017-04-21 ENCOUNTER — Ambulatory Visit (INDEPENDENT_AMBULATORY_CARE_PROVIDER_SITE_OTHER): Payer: 59 | Admitting: Family Medicine

## 2017-04-21 ENCOUNTER — Encounter: Payer: Self-pay | Admitting: Family Medicine

## 2017-04-21 VITALS — BP 120/84 | HR 49 | Temp 98.5°F | Ht 68.75 in | Wt 204.0 lb

## 2017-04-21 DIAGNOSIS — M1611 Unilateral primary osteoarthritis, right hip: Secondary | ICD-10-CM | POA: Diagnosis not present

## 2017-04-21 DIAGNOSIS — G5701 Lesion of sciatic nerve, right lower limb: Secondary | ICD-10-CM | POA: Diagnosis not present

## 2017-04-21 NOTE — Progress Notes (Signed)
Dr. Frederico Hamman T. Elaiza Shoberg, MD, [facility] [place], [address] Phone: [phone] Fax: [phone]  04/21/2017  Patient: Alex Clark, MRN: [id number], DOB: 1950/10/08, 66 y.o.  Primary Physician:  Lucille Passy, MD   Chief Complaint  Patient presents with  . Hip Pain    Right   Subjective:   Alex Clark is a 66 y.o. very pleasant male patient who presents with the following:  Today, the patient is actually not really having any significant pain. Previously and at various times when he has been standing up and when he is been doing things he has had some significant aching pain, particularly in the posterior aspect of his hip. He has occasionally had some more radicular type symptoms that have gone down his leg. This is gone down to approximately the length of the knee. He has not had any numbness or gait disturbance. No weakness.  He does have a remote history of back injury 10 years ago that was taken care of by neurosurgery, treated nonoperatively.  Hurt his back years ago.  Had been aching and really hurting.  Was standing and aching really. He points to back of buttocks on the right.   Intermittent but sometimes really bad.   Past Medical History, Surgical History, Social History, Family History, Problem List, Medications, and Allergies have been reviewed and updated if relevant.  Patient Active Problem List   Diagnosis Date Noted  . Right hip pain 04/13/2017  . Erectile dysfunction 04/05/2016  . Eosinophilic esophagitis 89/21/1941  . GERD (gastroesophageal reflux disease) 09/19/2014    Past Medical History:  Diagnosis Date  . Diverticulosis   . Eosinophilic esophagitis   . GERD (gastroesophageal reflux disease)     Past Surgical History:  Procedure Laterality Date  . COLONOSCOPY    . ESOPHAGOGASTRODUODENOSCOPY    . KNEE ARTHROSCOPY Left 2012    Social History   Social History  . Marital  status: Single    Spouse name: N/A  . Number of children: N/A  . Years of education: N/A   Occupational History  . Not on file.   Social History Main Topics  . Smoking status: Never Smoker  . Smokeless tobacco: Never Used  . Alcohol use No  . Drug use: No  . Sexual activity: Yes   Other Topics Concern  . 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       Family History  Problem Relation Age of Onset  . Heart disease Mother   . Hypertension Mother   . Hypertension Father   . Cancer Sister   . Cancer Maternal Uncle   . Colon cancer Neg Hx     Allergies  Allergen Reactions  . Pantoprazole Sodium Other (See Comments)    Headache    Medication list reviewed and updated in full in [facility].  GEN: No fevers, chills. Nontoxic. Primarily MSK c/o today. MSK: Detailed in the HPI GI: tolerating PO intake without difficulty Neuro: No numbness, parasthesias, or tingling associated. Otherwise the pertinent positives of the ROS are noted above.   Objective:   Vitals:   04/21/17 0934  BP: 120/84  Pulse: (!) 49  Temp: 98.5 F (36.9 C)  TempSrc: Oral  Weight: 204 lb (92.5 kg)  Height: 5' 8.75" (1.746 m)  GEN: WDWN, NAD, Non-toxic, Alert & Oriented x 3 HEENT: Atraumatic, Normocephalic.  Ears and Nose: No external deformity. EXTR: No clubbing/cyanosis/edema NEURO: Normal gait.  PSYCH: Normally interactive. Conversant. Not depressed or anxious appearing.  Calm demeanor.   HIP EXAM: SIDE: R ROM: Abduction, Flexion, Internal and External range of motion: Normal abduction, flexion to 90 is easy, internal and external rotation is modestly limited. Pain with terminal IROM and EROM: Minimal to mild GTB: NT SLR: NEG Knees: No effusion FABER: discomfort REVERSE FABER: + Piriformis: NT at direct palpation Str: flexion: 5/5 abduction: 5/5 adduction:  5/5 Strength testing non-tender  Range of motion at  the waist: Flexion: normal Extension: normal Lateral bending: normal Rotation: all normal  No echymosis or edema Rises to examination table with no difficulty Gait: non antalgic  Inspection/Deformity: N Paraspinus Tenderness: nt  B Ankle Dorsiflexion (L5,4): 5/5 B Great Toe Dorsiflexion (L5,4): 5/5 Heel Walk (L5): WNL Toe Walk (S1): WNL Rise/Squat (L4): WNL  SENSORY B Medial Foot (L4): WNL B Dorsum (L5): WNL B Lateral (S1): WNL Light Touch: WNL Pinprick: WNL  B SLR, seated: neg B SLR, supine: neg B Greater Troch: NT B Log Roll: neg B Stork: NT B Sciatic Notch: NT   Radiology: Dg Hip Unilat W Or W/o Pelvis 2-3 Views Right  Result Date: 04/13/2017 CLINICAL DATA:  Hip pain, more severe on the right. EXAM: DG HIP (WITH OR WITHOUT PELVIS) 2-3V RIGHT COMPARISON:  None. FINDINGS: Frontal pelvis as well as frontal and lateral right hip images were obtained. No fracture or dislocation. There is mild symmetric narrowing of both hip joints. No erosive change. IMPRESSION: Mild symmetric narrowing both hip joints. No fracture or dislocation. Electronically Signed   By: Lowella Grip III M.D.   On: 04/13/2017 11:35    Assessment and Plan:   Piriformis syndrome of right side  Primary osteoarthritis of right hip  >25 minutes spent in face to face time with patient, >50% spent in counselling or coordination of care   Patient does have modest osteoarthritis on radiograph, but on exam it appears that his pain focuses outside of the hip capsule. This appears to be primarily more posterior. He is very inflexible, and suspicious for piriformis syndrome. Involvement at the lumbar spine would be excluded, but on exam, his back appeared fairly unremarkable.  Recommended piriformis stretching and rehabilitation, along with some hip rehabilitation from [facility].  Follow-up: prn only  Meds ordered this encounter  Medications  .  acetaminophen (TYLENOL) 325 MG tablet   Medications Discontinued During This Encounter  Medication Reason  . sildenafil (VIAGRA) 25 MG tablet Completed Course  . HYDROcodone-homatropine (HYCODAN) 5-1.5 MG/5ML syrup Completed Course   Signed,  Frederico Hamman T. Sitara Cashwell, MD   Allergies as of 04/21/2017      Reactions   Pantoprazole Sodium Other (See Comments)   Headache      Medication List       Accurate as of 04/21/17  1:58 PM. Always use your most recent med list.          ADVIL 200 MG tablet Generic drug:  ibuprofen Take 200 mg by mouth every 6 (six) hours as needed.   omeprazole 40 MG capsule Commonly known as:  PRILOSEC TAKE 1 CAPSULE BY MOUTH DAILY BEFORE BREAKFAST.   TYLENOL 325 MG tablet Generic drug:  acetaminophen

## 2017-05-18 ENCOUNTER — Encounter: Payer: Self-pay | Admitting: Family Medicine

## 2017-05-18 ENCOUNTER — Ambulatory Visit (INDEPENDENT_AMBULATORY_CARE_PROVIDER_SITE_OTHER): Payer: 59 | Admitting: Family Medicine

## 2017-05-18 VITALS — BP 118/76 | HR 61 | Temp 98.3°F | Wt 202.0 lb

## 2017-05-18 DIAGNOSIS — M25551 Pain in right hip: Secondary | ICD-10-CM

## 2017-05-18 MED ORDER — PREDNISONE 20 MG PO TABS: 40.0000 mg | ORAL_TABLET | Freq: Every day | ORAL | 0 refills | Status: DC

## 2017-05-18 MED ORDER — DEXAMETHASONE SODIUM PHOSPHATE 10 MG/ML IJ SOLN
10.0000 mg | Freq: Once | INTRAMUSCULAR | Status: AC
  Administered 2017-05-18: 10 mg via INTRAMUSCULAR

## 2017-05-18 NOTE — Assessment & Plan Note (Signed)
With progressive symptoms. Given IM decadron in office and eRx sent for oral prednisone to start in 2 days- hopefully to calm inflammation.  Continue exercises. If symptoms persist, consider MRI/follow up with Dr. Lorelei Pont- ?intraarticular injection. The patient indicates understanding of these issues and agrees with the plan.

## 2017-05-18 NOTE — Patient Instructions (Signed)
Great to see you.  Start prednisone 2 tablets by mouth with breakfast daily for 7 days.  Start this 2 days from today ( Friday 05/20/2017).  No Ibuprofen, Advil or Alleve until after you finish the medication.

## 2017-05-18 NOTE — Progress Notes (Signed)
Subjective:   Patient ID: Alex Clark, male    DOB: [birth year], 66 y.o.   MRN: [id number]  Alex Clark is a pleasant 66 y.o. year old male who presents to clinic today with Acute Visit (hip leg knee pain)  on 05/18/2017  HPI:  Initially saw this patient for this complaint on 04/13/17.  Note reviewed.  At that time, he complained of bilateral hip pain, right greater than left x several months.  Aggravated by inactivity and squatting.  I advised ES Tylenol and ordered hip xray which showed:     EXAM: DG HIP (WITH OR WITHOUT PELVIS) 2-3V RIGHT  COMPARISON:  None.  FINDINGS: Frontal pelvis as well as frontal and lateral right hip images were obtained. No fracture or dislocation. There is mild symmetric narrowing of both hip joints. No erosive change.  IMPRESSION: Mild symmetric narrowing both hip joints. No fracture or dislocation.   Electronically Signed   By: Lowella Grip III M.D.   On: 04/13/2017 11:35  Also advised that he see Dr. Lorelei Pont, which he did on 04/21/17.  Note reviewed.  He diagnosed him with primary osteoarthritis of hip with piriformis syndrome. Advised stretching and exercises.  Here today because pain is getting progressively worse.  Severe pain with sitting, walking.  Localized to right hip, now with shooting pain to right knee. Current Outpatient Prescriptions on File Prior to Visit  Medication Sig Dispense Refill  . acetaminophen (TYLENOL) 325 MG tablet     . ibuprofen (ADVIL) 200 MG tablet Take 200 mg by mouth every 6 (six) hours as needed.    Marland Kitchen omeprazole (PRILOSEC) 40 MG capsule TAKE 1 CAPSULE BY MOUTH DAILY BEFORE BREAKFAST. 90 capsule 0   No current facility-administered medications on file prior to visit.     Allergies  Allergen Reactions  . Pantoprazole Sodium Other (See Comments)    Headache    Past Medical History:  Diagnosis Date  . Diverticulosis   . Eosinophilic esophagitis   . GERD (gastroesophageal reflux  disease)     Past Surgical History:  Procedure Laterality Date  . COLONOSCOPY    . ESOPHAGOGASTRODUODENOSCOPY    . KNEE ARTHROSCOPY Left 2012    Family History  Problem Relation Age of Onset  . Heart disease Mother   . Hypertension Mother   . Hypertension Father   . Cancer Sister   . Cancer Maternal Uncle   . Colon cancer Neg Hx     Social History   Social History  . Marital status: Single    Spouse name: N/A  . Number of children: N/A  . Years of education: N/A   Occupational History  . Not on file.   Social History Main Topics  . Smoking status: Never Smoker  . Smokeless tobacco: Never Used  . Alcohol use No  . Drug use: No  . Sexual activity: Yes   Other Topics Concern  . 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, [place] an daughter lives in [place]- she has two sons.      The PMH, PSH, Social History, Family History, Medications, and allergies have been reviewed in [facility], and have been updated if relevant.  Review of Systems  Musculoskeletal:       + right hip pain + right knee pain  All other systems reviewed and are negative.      Objective:    BP 118/76  Pulse 61   Temp 98.3 F (36.8 C)   Wt 202 lb (91.6 kg)   SpO2 99%   BMI 30.05 kg/m    Physical Exam  Constitutional: He is oriented to person, place, and time. He appears well-developed and well-nourished. No distress.  HENT:  Head: Atraumatic.  Eyes: Conjunctivae are normal.  Cardiovascular: Normal rate.   Pulmonary/Chest: Effort normal.  Musculoskeletal:       Right hip: He exhibits decreased range of motion and tenderness.  Neurological: He is alert and oriented to person, place, and time. No cranial nerve deficit.  Skin: Skin is warm and dry. He is not diaphoretic.  Psychiatric: He has a normal mood and affect. His behavior is normal. Judgment and thought content normal.  Nursing note and vitals reviewed.           Assessment & Plan:   Right hip pain - Plan: dexamethasone (DECADRON) injection 10 mg No Follow-up on file.

## 2017-05-26 ENCOUNTER — Telehealth: Payer: Self-pay

## 2017-05-26 NOTE — Telephone Encounter (Signed)
Noted! Thank you

## 2017-05-26 NOTE — Telephone Encounter (Signed)
Patient is schedule to see Dr. Lorelei Pont on Monday 05/30/17 @ 1145 am

## 2017-05-26 NOTE — Telephone Encounter (Signed)
The next step would be to see Dr. Lorelei Pont again to discuss if he is a good candidate for hip cortisone injections.

## 2017-05-26 NOTE — Telephone Encounter (Signed)
Pt left v/m; pt seen 05/18/17; pt is finishing prednisone today and pt cannot seen any improvement; pt still having a lot of pain in hip. Pt request cb from Dr Deborra Medina to discuss what is next step.

## 2017-05-30 ENCOUNTER — Ambulatory Visit: Payer: 59 | Admitting: Family Medicine

## 2017-06-01 ENCOUNTER — Ambulatory Visit (INDEPENDENT_AMBULATORY_CARE_PROVIDER_SITE_OTHER): Payer: 59 | Admitting: Family Medicine

## 2017-06-01 ENCOUNTER — Encounter: Payer: Self-pay | Admitting: Family Medicine

## 2017-06-01 VITALS — BP 118/82 | HR 51 | Temp 98.4°F | Ht 68.75 in | Wt 201.0 lb

## 2017-06-01 DIAGNOSIS — G5701 Lesion of sciatic nerve, right lower limb: Secondary | ICD-10-CM

## 2017-06-01 DIAGNOSIS — M5416 Radiculopathy, lumbar region: Secondary | ICD-10-CM

## 2017-06-01 DIAGNOSIS — M6798 Unspecified disorder of synovium and tendon, other site: Secondary | ICD-10-CM

## 2017-06-01 DIAGNOSIS — M76891 Other specified enthesopathies of right lower limb, excluding foot: Secondary | ICD-10-CM

## 2017-06-01 DIAGNOSIS — M25551 Pain in right hip: Secondary | ICD-10-CM

## 2017-06-01 DIAGNOSIS — M67951 Unspecified disorder of synovium and tendon, right thigh: Secondary | ICD-10-CM

## 2017-06-01 NOTE — Progress Notes (Addendum)
Dr. Frederico Hamman T. Kadesha Virrueta, MD, [facility] [place], [address] Phone: [phone] Fax: [phone]  06/01/2017  Patient: Alex Clark, MRN: [id number], DOB: 07/28/1951, 66 y.o.  Primary Physician:  Lucille Passy, MD   Chief Complaint  Patient presents with  . Hip Pain    Right-Injection   Subjective:   Alex Clark is a 66 y.o. very pleasant male patient who presents with the following:  Took some decadron then prednisone. This was after he saw my partner. No improvement at all initially, but yesterday and today feels better. The patient has a pool at home, and prior to this he had been in the pool doing some basic movements with his hip and legs.  Thinks he felt better after this and the next day.  He denies any anterior groin pain today, and I reviewed his hip x-rays again which only showed mild osteoarthritis of the hip surface.  No back pain.   R side and on the posterior deep buttocks. This is beneath the pelvic rim on the right side primarily.  He also has some mild pain with hip flexion approximately, but at this point this is minimal.  Past Medical History, Surgical History, Social History, Family History, Problem List, Medications, and Allergies have been reviewed and updated if relevant.  Patient Active Problem List   Diagnosis Date Noted  . Right hip pain 04/13/2017  . Erectile dysfunction 04/05/2016  . Eosinophilic esophagitis 25/95/6387  . GERD (gastroesophageal reflux disease) 09/19/2014    Past Medical History:  Diagnosis Date  . Diverticulosis   . Eosinophilic esophagitis   . GERD (gastroesophageal reflux disease)     Past Surgical History:  Procedure Laterality Date  . COLONOSCOPY    . ESOPHAGOGASTRODUODENOSCOPY    . KNEE ARTHROSCOPY Left 2012    Social History   Social History  . Marital status: Single    Spouse name: N/A  . Number of children: N/A  . Years of education: N/A     Occupational History  . Not on file.   Social History Main Topics  . Smoking status: Never Smoker  . Smokeless tobacco: Never Used  . Alcohol use No  . Drug use: No  . Sexual activity: Yes   Other Topics Concern  . 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       Family History  Problem Relation Age of Onset  . Heart disease Mother   . Hypertension Mother   . Hypertension Father   . Cancer Sister   . Cancer Maternal Uncle   . Colon cancer Neg Hx     Allergies  Allergen Reactions  . Pantoprazole Sodium Other (See Comments)    Headache    Medication list reviewed and updated in full in [place].  GEN: No fevers, chills. Nontoxic. Primarily MSK c/o today. MSK: Detailed in the HPI GI: tolerating PO intake without difficulty Neuro: No numbness, parasthesias, or tingling associated. Otherwise the pertinent positives of the ROS are noted above.   Objective:   BP 118/82   Pulse (!) 51   Temp 98.4 F (36.9 C) (Oral)   Ht 5' 8.75" (1.746 m)   Wt 201 lb (91.2 kg)   BMI 29.90 kg/m    GEN: WDWN, NAD, Non-toxic, Alert & Oriented x  3 HEENT: Atraumatic, Normocephalic.  Ears and Nose: No external deformity. EXTR: No clubbing/cyanosis/edema NEURO: Normal gait.  PSYCH: Normally interactive. Conversant. Not depressed or anxious appearing.  Calm demeanor.   HIP EXAM: SIDE: R ROM: Abduction, Flexion, Internal and External range of motion: full Pain with terminal IROM and EROM: mild/minimal GTB: NT Primarily TTP posterior pelvic rim and inferior to this  SLR: NEG Knees: No effusion FABER: NT REVERSE FABER: NT, neg Piriformis: NT at direct palpation Str: flexion: 5/5 abduction: 5/5 adduction: 5/5 Strength testing non-tender  Radiology: Dg Hip Unilat W Or W/o Pelvis 2-3 Views Right  Result Date: 04/13/2017 CLINICAL DATA:  Hip pain, more severe on  the right. EXAM: DG HIP (WITH OR WITHOUT PELVIS) 2-3V RIGHT COMPARISON:  None. FINDINGS: Frontal pelvis as well as frontal and lateral right hip images were obtained. No fracture or dislocation. There is mild symmetric narrowing of both hip joints. No erosive change. IMPRESSION: Mild symmetric narrowing both hip joints. No fracture or dislocation. Electronically Signed   By: Lowella Grip III M.D.   On: 04/13/2017 11:35   Assessment and Plan:   Right lumbar radiculopathy  Tendinopathy of right gluteus medius  Right hip pain  Piriformis syndrome of right side  Hip flexor tendinitis, right - Plan: methylPREDNISolone acetate (DEPO-MEDROL) injection 80 mg   At this point, the patient's hip is calm down quite a bit compared to the last couple of weeks prior.  He has some very mild hip flexor tendinopathy, minimally symptomatic at this point.  Primary source of pain appears to be in the upper gluteus medius insertion as well as the piriformis on the right.    Are reviewed rehabilitation that he can do in his pool most days of the week, and I think that this will be of the greatest import to his improving and becoming asymptomatic over time.  We'll do a posterior pelvic injection along the gluteus medius insertion to help provide him some more immediate relief.  Gluteus medius / posterior hip bursitis Injection, R Verbal consent obtained. Risks (including infection, potential atrophy), benefits, and alternatives reviewed. Maximal tenderness identified, sterilely prepped with Chloraprep. Ethyl Chloride used for anesthesia. 8 cc of Lidocaine 1% injected with 2 mL of Depo-Medrol 40 mg along posterior pelvic rim with 3 1/2 inch spinal needle taken to bone via 2 insertion points. No bleeding and no complications. Decreased pain after injection. Needle: 22 gauge spinal needle   Addendum, 08/03/2017: I have spoken to the patient, and he is doing worse. He has had normal l-spine x-rays per report at  chiropractor and manipulation and accupuncture have not helped him at all. He is having R buttocks pain and pain going down his R leg through and to the ankle. I will obtain an MRI of the lumbar spine without contrast to further evaluate for foraminal stenosis, spinal stenosis, or other cord pathology.  Follow-up: No Follow-up on file.  Medications Discontinued During This Encounter  Medication Reason  . predniSONE (DELTASONE) 20 MG tablet Completed Course   Signed,  Pasqualina Colasurdo T. Shera Laubach, MD   Patient's Medications  New Prescriptions   No medications on file  Previous Medications   ACETAMINOPHEN (TYLENOL) 325 MG TABLET       IBUPROFEN (ADVIL) 200 MG TABLET    Take 200 mg by mouth every 6 (six) hours as needed.   OMEPRAZOLE (PRILOSEC) 40 MG CAPSULE    TAKE 1 CAPSULE BY MOUTH DAILY BEFORE BREAKFAST.  Modified Medications   No medications on  file  Discontinued Medications   PREDNISONE (DELTASONE) 20 MG TABLET    Take 2 tablets (40 mg total) by mouth daily with breakfast.

## 2017-06-09 MED ORDER — METHYLPREDNISOLONE ACETATE 40 MG/ML IJ SUSP
80.0000 mg | Freq: Once | INTRAMUSCULAR | Status: AC
  Administered 2017-05-31: 80 mg via INTRA_ARTICULAR

## 2017-06-09 NOTE — Addendum Note (Signed)
Addended by: Carter Kitten on: 06/09/2017 03:03 PM   Modules accepted: Orders

## 2017-06-15 ENCOUNTER — Other Ambulatory Visit: Payer: Self-pay | Admitting: Family Medicine

## 2017-08-01 ENCOUNTER — Telehealth: Payer: Self-pay | Admitting: Family Medicine

## 2017-08-01 NOTE — Telephone Encounter (Signed)
I would be comfortable getting a hip MRI in this case directly with follow-up. I will try to arrange this myself.

## 2017-08-01 NOTE — Telephone Encounter (Signed)
Copied from [place] [id number]. Topic: Inquiry >> Jul 29, 2017  4:04 PM Alex Clark wrote: Reason for CRM: pt made an apt with Dr Deborra Medina for 11/7 at 1130 for leg and hip pain, he wants to know if dr Deborra Medina can schedule him an mri without him coming in for office visit and would like to be called if he can and he will cx apt if he can just go in for mri.   Will route to Dr. Lorelei Pont for his opinion.

## 2017-08-02 NOTE — Telephone Encounter (Signed)
I called - LMOM. No answer. I need clarification on symptoms to order proper MRI.

## 2017-08-03 ENCOUNTER — Other Ambulatory Visit: Payer: Self-pay | Admitting: Family Medicine

## 2017-08-03 NOTE — Telephone Encounter (Signed)
See addendum

## 2017-08-03 NOTE — Addendum Note (Signed)
Addended by: Owens Loffler on: 08/03/2017 05:16 PM   Modules accepted: Orders

## 2017-08-10 ENCOUNTER — Ambulatory Visit: Payer: 59 | Admitting: Family Medicine

## 2017-08-10 ENCOUNTER — Ambulatory Visit (HOSPITAL_COMMUNITY)
Admission: RE | Admit: 2017-08-10 | Discharge: 2017-08-10 | Disposition: A | Discharge status: Home / Self Care | Payer: 59 | Source: Ambulatory Visit | Attending: Family Medicine | Admitting: Family Medicine

## 2017-08-10 DIAGNOSIS — M48061 Spinal stenosis, lumbar region without neurogenic claudication: Secondary | ICD-10-CM | POA: Insufficient documentation

## 2017-08-10 DIAGNOSIS — M5416 Radiculopathy, lumbar region: Secondary | ICD-10-CM | POA: Insufficient documentation

## 2017-08-10 DIAGNOSIS — M5127 Other intervertebral disc displacement, lumbosacral region: Secondary | ICD-10-CM | POA: Diagnosis not present

## 2017-08-10 DIAGNOSIS — M5126 Other intervertebral disc displacement, lumbar region: Secondary | ICD-10-CM | POA: Diagnosis not present

## 2017-08-15 ENCOUNTER — Other Ambulatory Visit: Payer: Self-pay | Admitting: Family Medicine

## 2017-08-15 DIAGNOSIS — M5136 Other intervertebral disc degeneration, lumbar region: Secondary | ICD-10-CM

## 2017-08-15 DIAGNOSIS — M5416 Radiculopathy, lumbar region: Secondary | ICD-10-CM

## 2017-11-01 ENCOUNTER — Ambulatory Visit (INDEPENDENT_AMBULATORY_CARE_PROVIDER_SITE_OTHER): Payer: 59 | Admitting: Family Medicine

## 2017-11-01 ENCOUNTER — Encounter: Payer: Self-pay | Admitting: Family Medicine

## 2017-11-01 VITALS — BP 128/60 | HR 64 | Temp 98.8°F | Ht 69.0 in | Wt 194.4 lb

## 2017-11-01 DIAGNOSIS — Z0000 Encounter for general adult medical examination without abnormal findings: Secondary | ICD-10-CM | POA: Diagnosis not present

## 2017-11-01 DIAGNOSIS — Z23 Encounter for immunization: Secondary | ICD-10-CM

## 2017-11-01 DIAGNOSIS — K2100 Gastro-esophageal reflux disease with esophagitis, without bleeding: Secondary | ICD-10-CM

## 2017-11-01 LAB — COMPREHENSIVE METABOLIC PANEL
ALK PHOS: 55 U/L (ref 39–117)
ALT: 19 U/L (ref 0–53)
AST: 16 U/L (ref 0–37)
Albumin: 4.5 g/dL (ref 3.5–5.2)
BILIRUBIN TOTAL: 0.9 mg/dL (ref 0.2–1.2)
BUN: 16 mg/dL (ref 6–23)
CO2: 31 meq/L (ref 19–32)
Calcium: 9.6 mg/dL (ref 8.4–10.5)
Chloride: 104 mEq/L (ref 96–112)
Creatinine, Ser: 1.25 mg/dL (ref 0.40–1.50)
GFR: 61.79 mL/min (ref >60.00)
GLUCOSE: 126 mg/dL — AB (ref 70–99)
Potassium: 4.9 mEq/L (ref 3.5–5.1)
SODIUM: 146 meq/L — AB (ref 135–145)
TOTAL PROTEIN: 7.4 g/dL (ref 6.0–8.3)

## 2017-11-01 LAB — LIPID PANEL
CHOL/HDL RATIO: 5
Cholesterol: 229 mg/dL — ABNORMAL HIGH (ref 0–200)
HDL: 46.1 mg/dL (ref >39.00)
LDL Cholesterol: 156 mg/dL — ABNORMAL HIGH (ref 0–99)
NONHDL: 183.36
Triglycerides: 136 mg/dL (ref 0.0–149.0)
VLDL: 27.2 mg/dL (ref 0.0–40.0)

## 2017-11-01 LAB — PSA: PSA: 1.63 ng/mL (ref 0.10–4.00)

## 2017-11-01 MED ORDER — OMEPRAZOLE 40 MG PO CPDR: DELAYED_RELEASE_CAPSULE | ORAL | 3 refills | Status: DC

## 2017-11-01 NOTE — Assessment & Plan Note (Signed)
Continue PPI. Refills sent.

## 2017-11-01 NOTE — Assessment & Plan Note (Signed)
Reviewed preventive care protocols, scheduled due services, and updated immunizations Discussed nutrition, exercise, diet, and healthy lifestyle. Orders Placed This Encounter  Procedures  . Tdap vaccine greater than or equal to 65yo IM  . Comprehensive metabolic panel  . Lipid panel  . PSA

## 2017-11-01 NOTE — Progress Notes (Signed)
Subjective:   Patient ID: Alex Clark, male    DOB: [birth year], 67 y.o.   MRN: [id number]  Alex Clark is a pleasant 67 y.o. year old male who presents to clinic today with Annual Exam (Patient is here today for a CPE.  He is currently fasting.  He had his Flu shot on 10.15.18 at the [facility].  Agrees to get Tdap today.  He states that he had a shot in his back for a bulging disc by Dr. Cyndy Freeze & Grover Canavan and will them again on 2.11.19.)  on 11/01/2017  HPI:  Health Maintenance  Topic Date Due  . COLONOSCOPY  09/04/2024  . TETANUS/TDAP  11/02/2027  . INFLUENZA VACCINE  Completed  . Hepatitis C Screening  Completed  . HIV Screening  Completed     GERD- Previous PCP referred him to GI for dysphagia- saw Dr. Carlean Purl.  Endoscopy and Colonoscopy done on 09/04/14- neg colonoscopy.  Endoscopy with esophageal dilatation- biopsy showed eosinophilic esophagitis- started on Protonix 40 mg twice daily.  He has never had any "classic" symptoms of reflux.  Saw Dr. Carlean Purl again on 11/12/14.  Note reviewed. Protonix dc'd as may have been contributing to headaches and started on Omeprazole 40 mg daily.  Has had no further headaches.  He is asking for viagra.  Used this in past.  Often has problems with "stamina" but is able to get an erection without difficulty.  No recent changes in sexual function. Lab Results  Component Value Date   CHOL 230 (H) 04/05/2016   HDL 46.10 04/05/2016   LDLDIRECT 166.0 04/05/2016   TRIG 217.0 (H) 04/05/2016   CHOLHDL 5 04/05/2016   Lab Results  Component Value Date   CREATININE 1.33 04/05/2016   Lab Results  Component Value Date   WBC 11.8 (H) 04/05/2016   HGB 16.5 04/05/2016   HCT 48.7 04/05/2016   MCV 87.1 04/05/2016   PLT 214.0 04/05/2016   No results found for: TSH   Current Outpatient Medications on File Prior to Visit  Medication Sig Dispense Refill  . acetaminophen (TYLENOL) 325 MG tablet     . ibuprofen (ADVIL) 200 MG tablet Take  200 mg by mouth every 6 (six) hours as needed.     No current facility-administered medications on file prior to visit.     Allergies  Allergen Reactions  . Pantoprazole Sodium Other (See Comments)    Headache    Past Medical History:  Diagnosis Date  . Diverticulosis   . Eosinophilic esophagitis   . GERD (gastroesophageal reflux disease)     Past Surgical History:  Procedure Laterality Date  . COLONOSCOPY    . ESOPHAGOGASTRODUODENOSCOPY    . KNEE ARTHROSCOPY Left 2012    Family History  Problem Relation Age of Onset  . Heart disease Mother   . Hypertension Mother   . Hypertension Father   . Cancer Sister   . Cancer Maternal Uncle   . Colon cancer Neg Hx     Social History   Socioeconomic History  . Marital status: Single    Spouse name: Not on file  . Number of children: Not on file  . Years of education: Not on file  . Highest education level: Not on file  Social Needs  . Financial resource strain: Not on file  . Food insecurity - worry: Not on file  . Food insecurity - inability: Not on file  . Transportation needs - medical: Not on file  .  Transportation needs - non-medical: Not on file  Occupational History  . Not on file  Tobacco Use  . Smoking status: Never Smoker  . Smokeless tobacco: Never Used  Substance and Sexual Activity  . Alcohol use: No    Alcohol/week: 0.0 oz  . Drug use: No  . Sexual activity: Yes  Other Topics Concern  . 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   The PMH, PSH, Social History, Family History, Medications, and allergies have been reviewed in [facility], and have been updated if relevant.   Review of Systems  Constitutional: Negative.   Respiratory: Negative.   Cardiovascular: Negative.   Gastrointestinal: Negative.   Endocrine: Negative.   Genitourinary: Negative.   Musculoskeletal: Negative for back pain, gait  problem and joint swelling.  Skin: Negative.   Allergic/Immunologic: Negative.   Neurological: Negative for dizziness, numbness and headaches.  Hematological: Negative.   Psychiatric/Behavioral: Negative.   All other systems reviewed and are negative.      Objective:    BP 128/60 (BP Location: Left Arm, Patient Position: Sitting, Cuff Size: Normal)   Pulse 64   Temp 98.8 F (37.1 C) (Oral)   Ht 5\' 9"  (1.753 m)   Wt 194 lb 6.4 oz (88.2 kg)   SpO2 97%   BMI 28.71 kg/m    Physical Exam  Constitutional: He is oriented to person, place, and time. He appears well-developed and well-nourished. No distress.  HENT:  Head: Normocephalic and atraumatic.  Eyes: Pupils are equal, round, and reactive to light.  Neck: Normal range of motion.  Cardiovascular: Normal rate and regular rhythm.  Pulmonary/Chest: Effort normal and breath sounds normal.  Abdominal: Soft. Bowel sounds are normal.  Musculoskeletal: Normal range of motion.       Right hip: Normal.       Left hip: Normal.  Neurological: He is alert and oriented to person, place, and time. He has normal reflexes. No cranial nerve deficit. Coordination normal.  Skin: Skin is warm and dry.  Psychiatric: He has a normal mood and affect. His behavior is normal. Judgment and thought content normal.  Nursing note and vitals reviewed.         Assessment & Plan:   Need for Tdap vaccination - Plan: Tdap vaccine greater than or equal to 7yo IM  Visit for well man health check - Plan: Comprehensive metabolic panel, Lipid panel, PSA No Follow-up on file.

## 2017-11-01 NOTE — Patient Instructions (Signed)
Great to see you. I will call you with your lab results from today and you can view them online.   

## 2017-11-03 ENCOUNTER — Telehealth: Payer: Self-pay

## 2017-11-03 DIAGNOSIS — R7301 Impaired fasting glucose: Secondary | ICD-10-CM

## 2017-11-03 NOTE — Telephone Encounter (Signed)
-----   Message from Lucille Passy, MD sent at 11/03/2017  8:46 AM EST ----- Yes please have him come in for a1c.  Thanks!

## 2017-11-03 NOTE — Telephone Encounter (Signed)
Pt given results and instructions per Dr Deborra Medina; pt scheduled labs at [facility] 11/09/17 at 0945; pt verbalizes understanding

## 2017-11-03 NOTE — Telephone Encounter (Signed)
I called and LMOVM that we received his lab results and his sugar has increased and we need to do a lab test that does not require him to be fasting/I asked that he please call and schedule this lab visit/future orders created/thx dmf

## 2017-11-09 ENCOUNTER — Other Ambulatory Visit (INDEPENDENT_AMBULATORY_CARE_PROVIDER_SITE_OTHER): Payer: 59

## 2017-11-09 DIAGNOSIS — R7301 Impaired fasting glucose: Secondary | ICD-10-CM

## 2017-11-09 LAB — HEMOGLOBIN A1C: Hgb A1c MFr Bld: 7 % — ABNORMAL HIGH (ref 4.6–6.5)

## 2017-11-15 ENCOUNTER — Ambulatory Visit: Payer: 59 | Admitting: Family Medicine

## 2017-11-15 DIAGNOSIS — E119 Type 2 diabetes mellitus without complications: Secondary | ICD-10-CM | POA: Insufficient documentation

## 2017-11-16 ENCOUNTER — Ambulatory Visit: Payer: 59 | Admitting: Family Medicine

## 2017-11-16 ENCOUNTER — Encounter: Payer: Self-pay | Admitting: Family Medicine

## 2017-11-16 VITALS — BP 126/94 | HR 52 | Temp 98.6°F | Ht 69.0 in | Wt 191.0 lb

## 2017-11-16 DIAGNOSIS — E119 Type 2 diabetes mellitus without complications: Secondary | ICD-10-CM

## 2017-11-16 MED ORDER — METFORMIN HCL 500 MG PO TABS
500.0000 mg | ORAL_TABLET | Freq: Every day | ORAL | 3 refills | Status: DC
Start: 2017-11-16 — End: 2019-05-21

## 2017-11-16 NOTE — Progress Notes (Signed)
Subjective:   Patient ID: Alex Clark, male    DOB: 1951-05-13, 67 y.o.   MRN: [id number]  DJIBRIL Clark is a pleasant 67 y.o. year old male who presents to clinic today with Diabetes (Patient is here today to discuss Tx for NODM. Since he was called about scheduling this visit for this he has changed his eating habits and doesn't drink sodas any longer.  He has cut out bread and all sugar.  He has lost a few pounds since last visit.)  on 11/16/2017  HPI:  DM- new onset.  Found on routine lab screening. He has changed his diet completely since we called him with this diagnosis- has cut out candy, breads, sweet tea.  Wt Readings from Last 3 Encounters:  11/16/17 191 lb (86.6 kg)  11/01/17 194 lb 6.4 oz (88.2 kg)  06/01/17 201 lb (91.2 kg)    Lab Results  Component Value Date   HGBA1C 7.0 (H) 11/09/2017   Denies increased thirst or urination.  Does have family history of diabetes. Current Outpatient Medications on File Prior to Visit  Medication Sig Dispense Refill  . acetaminophen (TYLENOL) 325 MG tablet     . ibuprofen (ADVIL) 200 MG tablet Take 200 mg by mouth every 6 (six) hours as needed.    Marland Kitchen omeprazole (PRILOSEC) 40 MG capsule TAKE 1 CAPSULE BY MOUTH DAILY BEFORE BREAKFAST 90 capsule 3   No current facility-administered medications on file prior to visit.     Allergies  Allergen Reactions  . Pantoprazole Sodium Other (See Comments)    Headache    Past Medical History:  Diagnosis Date  . Diverticulosis   . Eosinophilic esophagitis   . GERD (gastroesophageal reflux disease)     Past Surgical History:  Procedure Laterality Date  . COLONOSCOPY    . ESOPHAGOGASTRODUODENOSCOPY    . KNEE ARTHROSCOPY Left 2012    Family History  Problem Relation Age of Onset  . Heart disease Mother   . Hypertension Mother   . Hypertension Father   . Cancer Sister   . Cancer Maternal Uncle   . Colon cancer Neg Hx     Social History   Socioeconomic History  . Marital  status: Single    Spouse name: Not on file  . Number of children: Not on file  . Years of education: Not on file  . Highest education level: Not on file  Social Needs  . Financial resource strain: Not on file  . Food insecurity - worry: Not on file  . Food insecurity - inability: Not on file  . Transportation needs - medical: Not on file  . Transportation needs - non-medical: Not on file  Occupational History  . Not on file  Tobacco Use  . Smoking status: Never Smoker  . Smokeless tobacco: Never Used  Substance and Sexual Activity  . Alcohol use: No    Alcohol/week: 0.0 oz  . Drug use: No  . Sexual activity: Yes  Other Topics Concern  . 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   The PMH, PSH, Social History, Family History, Medications, and allergies have been reviewed in [facility], and have been updated if relevant.   Review of Systems  Respiratory: Negative.   Cardiovascular: Negative.   Gastrointestinal: Negative.   Endocrine: Negative.   All other systems reviewed and are negative.  Objective:    BP (!) 126/94 (BP Location: Left Arm, Patient Position: Sitting, Cuff Size: Normal)   Pulse (!) 52   Temp 98.6 F (37 C) (Oral)   Ht 5\' 9"  (1.753 m)   Wt 191 lb (86.6 kg)   SpO2 98%   BMI 28.21 kg/m    Physical Exam  General:  pleasant male in no acute distress Eyes:  PERRL Ears:  External ear exam shows no significant lesions or deformities.  Nose:  External nasal examination shows no deformity or inflammation.  Mouth:  Oral mucosa and oropharynx without lesions or exudates.  Teeth in good repair. Neck:  no carotid bruit or thyromegaly no cervical or supraclavicular lymphadenopathy  Lungs:  Normal respiratory effort, chest expands symmetrically. Lungs are clear to auscultation, no crackles or wheezes. Heart:  Normal rate and regular rhythm. S1 and S2  normal without gallop, murmur, click, rub or other extra sounds. Pulses:  R and L posterior tibial pulses are full and equal bilaterally  Extremities:  no edema  Psych:  Good eye contact, not anxious or depressed appearing       Assessment & Plan:   New onset type 2 diabetes mellitus ([place]) No Follow-up on file.

## 2017-11-16 NOTE — Patient Instructions (Signed)
Great to see you. We are starting Metformin 500 mg daily with breakfast. We are also referring you to a diabetic nutritionist.  Please come back to see me in 3 months.

## 2017-11-16 NOTE — Assessment & Plan Note (Signed)
>  15 minutes spent in face to face time with patient, >50% spent in counselling or coordination of care. Discussed tx plan- eat right diet handout given. He is very motivated to reverse the diagnosis. Refer to diabetic teaching. Start Metformin 500 mg daily with breakfast. Follow up in 3 months. The patient indicates understanding of these issues and agrees with the plan.

## 2017-11-29 ENCOUNTER — Other Ambulatory Visit: Payer: Self-pay

## 2017-11-29 MED ORDER — OMEPRAZOLE 40 MG PO CPDR: DELAYED_RELEASE_CAPSULE | ORAL | 3 refills | Status: DC

## 2017-12-07 ENCOUNTER — Encounter: Payer: 59 | Attending: Family Medicine | Admitting: *Deleted

## 2017-12-07 DIAGNOSIS — Z713 Dietary counseling and surveillance: Secondary | ICD-10-CM | POA: Diagnosis not present

## 2017-12-07 DIAGNOSIS — E119 Type 2 diabetes mellitus without complications: Secondary | ICD-10-CM | POA: Diagnosis not present

## 2017-12-07 NOTE — Patient Instructions (Signed)
Plan:  Aim for 3 Carb Choices per meal (45 grams) +/- 1 either way  Aim for 0-2 Carbs per snack if hungry  Include protein in moderation with your meals and snacks Consider reading food labels for Total Carbohydrate of foods Continue with your activity level daily as tolerated Continue taking medication as directed by MD

## 2017-12-13 NOTE — Progress Notes (Signed)
Diabetes Self-Management Education  Visit Type: First/Initial  Appt. Start Time: 1400 Appt. End Time: 9417  12/13/2017  [name], identified by name and date of birth, is a 67 y.o. male with a diagnosis of Diabetes: Type 2. 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 He lives alone and does not cook much. He states he has changed his eating habits considerably and has lost about 20 pounds since diagnosed 3 weeks ago. He reports he previously drank large quantities of sweetened beverages and ate sugary foods several times every day. He has stopped all of that and is also restricting his bread intake.   ASSESSMENT  Height 5\' 9"  (1.753 m), weight 183 lb 1.6 oz (83.1 kg). Body mass index is 27.04 kg/m.  Diabetes Self-Management Education - 12/07/17 1409      Visit Information   Visit Type  First/Initial      Initial Visit   Diabetes Type  Type 2    Are you currently following a meal plan?  No but is restricting all carbohydrates and most fats    Are you taking your medications as prescribed?  Yes    Date Diagnosed  11/2017      Health Coping   How would you rate your overall health?  Good      Psychosocial Assessment   Patient Belief/Attitude about Diabetes  Motivated to manage diabetes    Self-care barriers  None    Self-management support  Family    Other persons present  Patient    Patient Concerns  Nutrition/Meal planning;Glycemic Control    Special Needs  None    Learning Readiness  Change in progress    How often do you need to have someone help you when you read instructions, pamphlets, or other written materials from your doctor or pharmacy?  1 - Never    What is the last grade level you completed in school?  12      Pre-Education Assessment   Patient understands the diabetes disease and treatment process.  Needs Instruction    Patient understands incorporating nutritional management into lifestyle.  Needs Instruction    Patient undertands incorporating physical activity into lifestyle.  Needs Instruction    Patient understands using medications safely.  Needs Instruction    Patient understands monitoring blood glucose, interpreting and using results  Needs Instruction    Patient understands prevention, detection, and treatment of acute complications.  Needs Instruction    Patient understands prevention, detection, and treatment of chronic complications.  Needs Instruction    Patient understands how to develop strategies to address psychosocial issues.  Needs Instruction    Patient understands how to develop strategies to promote health/change behavior.  Needs Instruction      Complications   Last HgB A1C per patient/outside source  7 %    How often do you check your blood sugar?  Not recommended by provider    Have you had a dilated eye exam in the past 12 months?  No    Are you checking your feet?  No      Dietary Intake   Breakfast  egg mcmuffin without the bun    Lunch  cheeseburger, no fries OR restaurant meal; [place] steak and cheese with a little bread OR BBQ    Snack (afternoon)  Cheese'it pretzel mixture snack occasionally    Dinner  stewed tomatoes with crackers OR grilled chicken strips with vegetable OR meat,  vegetables and small serving of vegetable with miso soup    Snack (evening)  sugar free Werther's candy occasionally    Beverage(s)  water      Exercise   Exercise Type  ADL's    How many days per week to you exercise?  0    How many minutes per day do you exercise?  0    Total minutes per week of exercise  0      Patient Education   Previous Diabetes Education  No    Disease state   Definition of diabetes, type 1 and 2, and the diagnosis of diabetes;Factors that contribute to the development of diabetes    Nutrition management   Role of diet in the treatment of diabetes and the relationship between the three main macronutrients and blood glucose level;Carbohydrate counting;Meal timing  in regards to the patients' current diabetes medication.;Food label reading, portion sizes and measuring food.;Reviewed blood glucose goals for pre and post meals and how to evaluate the patients' food intake on their blood glucose level.    Physical activity and exercise   Role of exercise on diabetes management, blood pressure control and cardiac health.;Helped patient identify appropriate exercises in relation to his/her diabetes, diabetes complications and other health issue.    Medications  Reviewed patients medication for diabetes, action, purpose, timing of dose and side effects.    Monitoring  Purpose and frequency of SMBG.    Chronic complications  Relationship between chronic complications and blood glucose control    Psychosocial adjustment  Role of stress on diabetes      Individualized Goals (developed by patient)   Nutrition  Follow meal plan discussed    Physical Activity  Exercise 3-5 times per week    Medications  take my medication as prescribed    Monitoring   Other (comment) Consider monitoring to have BG info inbetween A1c tests      Post-Education Assessment   Patient understands the diabetes disease and treatment process.  Demonstrates understanding / competency    Patient understands incorporating nutritional management into lifestyle.  Demonstrates understanding / competency    Patient undertands incorporating physical activity into lifestyle.  Demonstrates understanding / competency    Patient understands using medications safely.  Demonstrates understanding / competency    Patient understands monitoring blood glucose, interpreting and using results  Needs Review    Patient understands prevention, detection, and treatment of chronic complications.  Demonstrates understanding / competency      Outcomes   Expected Outcomes  Demonstrated interest in learning. Expect positive outcomes    Future DMSE  PRN    Program Status  Completed       Individualized Plan for  Diabetes Self-Management Training:   Learning Objective:  Patient will have a greater understanding of diabetes self-management. Patient education plan is to attend individual and/or group sessions per assessed needs and concerns.   Plan:   Patient Instructions  Plan:  Aim for 3 Carb Choices per meal (45 grams) +/- 1 either way  Aim for 0-2 Carbs per snack if hungry  Include protein in moderation with your meals and snacks Consider reading food labels for Total Carbohydrate of foods Continue with your activity level daily as tolerated Continue taking medication as directed by MD  Expected Outcomes:  Demonstrated interest in learning. Expect positive outcomes  Education material provided: Living Well with Diabetes, A1C conversion sheet, Meal plan card and Carbohydrate counting sheet  If problems or questions, patient to contact  team via:  Phone  Future DSME appointment: PRN

## 2018-04-04 ENCOUNTER — Ambulatory Visit: Payer: 59

## 2018-04-04 ENCOUNTER — Other Ambulatory Visit: Payer: Self-pay | Admitting: Family Medicine

## 2018-04-04 ENCOUNTER — Other Ambulatory Visit (INDEPENDENT_AMBULATORY_CARE_PROVIDER_SITE_OTHER): Payer: 59

## 2018-04-04 DIAGNOSIS — E119 Type 2 diabetes mellitus without complications: Secondary | ICD-10-CM

## 2018-04-04 LAB — COMPREHENSIVE METABOLIC PANEL
ALBUMIN: 4.4 g/dL (ref 3.5–5.2)
ALT: 14 U/L (ref 0–53)
AST: 16 U/L (ref 0–37)
Alkaline Phosphatase: 51 U/L (ref 39–117)
BUN: 18 mg/dL (ref 6–23)
CHLORIDE: 104 meq/L (ref 96–112)
CO2: 27 meq/L (ref 19–32)
CREATININE: 1.09 mg/dL (ref 0.40–1.50)
Calcium: 9.2 mg/dL (ref 8.4–10.5)
GFR: 72.27 mL/min (ref >60.00)
GLUCOSE: 102 mg/dL — AB (ref 70–99)
Potassium: 4.4 mEq/L (ref 3.5–5.1)
SODIUM: 138 meq/L (ref 135–145)
Total Bilirubin: 0.7 mg/dL (ref 0.2–1.2)
Total Protein: 7 g/dL (ref 6.0–8.3)

## 2018-04-04 LAB — HEMOGLOBIN A1C: Hgb A1c MFr Bld: 6 % (ref 4.6–6.5)

## 2018-08-20 IMAGING — MR MR LUMBAR SPINE W/O CM
4 of 5 series · 17 of 48 positions shown · non-contrast
Comparison: Prior MRI from 11/11/2011.

CLINICAL DATA: Initial evaluation for low back pain radiating to
right lower extremity.

EXAM:
MRI LUMBAR SPINE WITHOUT CONTRAST
TECHNIQUE: Multiplanar, multisequence MR imaging of the lumbar spine was
performed. No intravenous contrast was administered.

[Series 3: T1 · sagittal · 4.0mm · 0.51mm/px · 3 of 12 slices shown (1 of 2)]
[im 3/12]
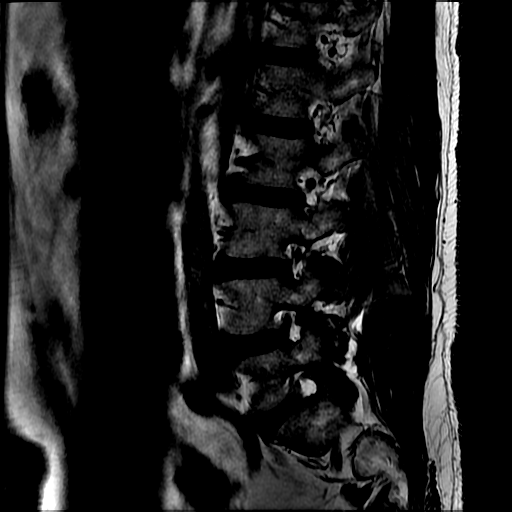
[im 7/12]
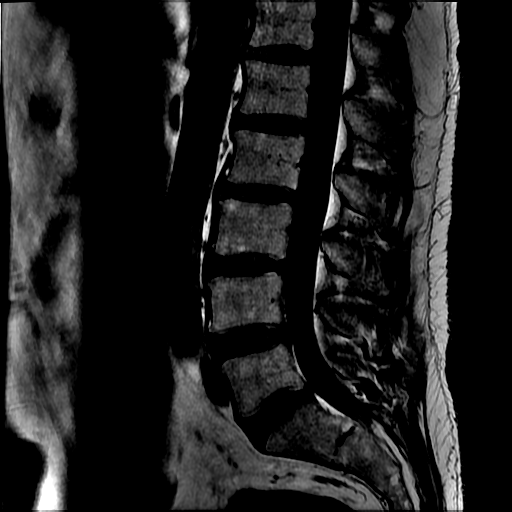
[im 12/12]
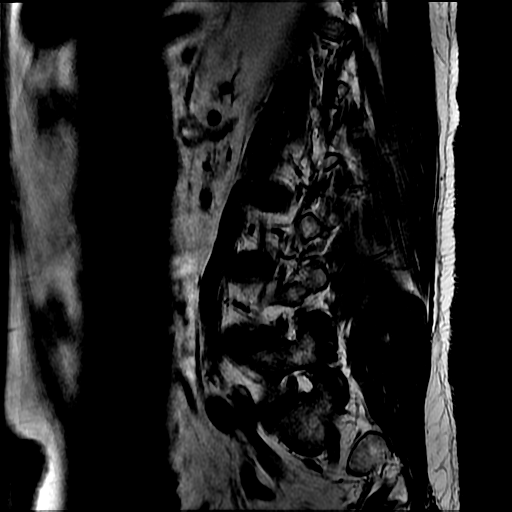

[Series 4: T2 post-contrast · sagittal · 4.0mm · 0.51mm/px · 6 of 14 slices shown]
[im 1/14]
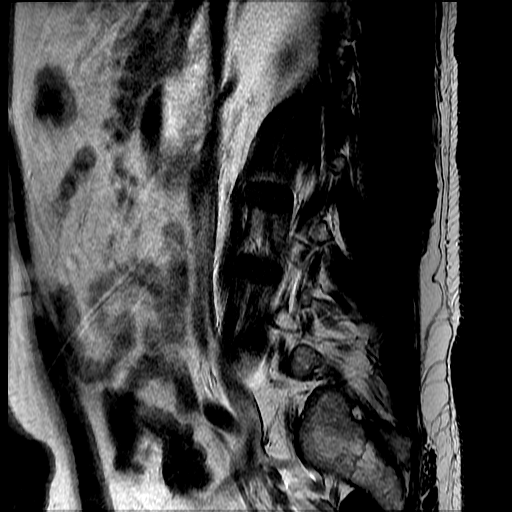
[im 3/14]
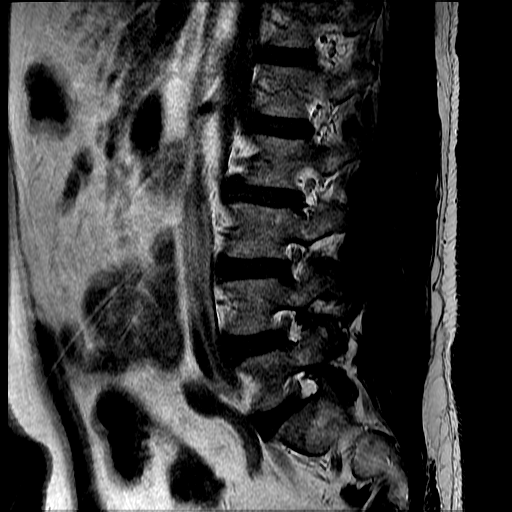
[im 6/14]
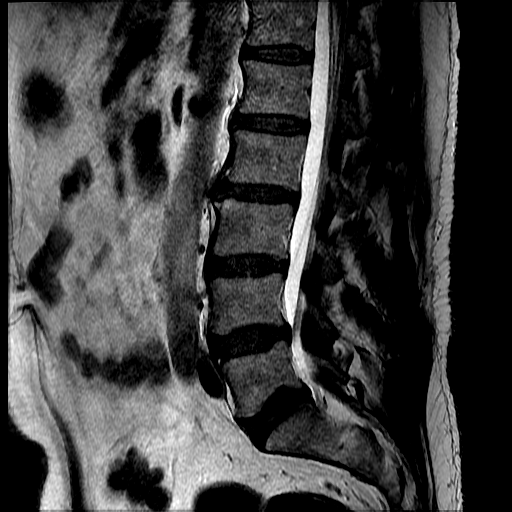
[im 8/14]
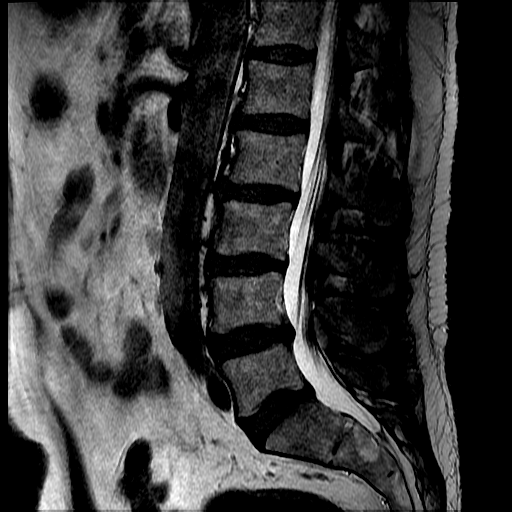
[im 11/14]
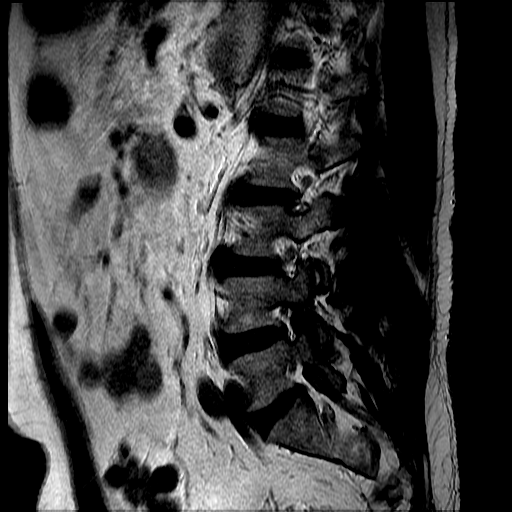
[im 14/14]
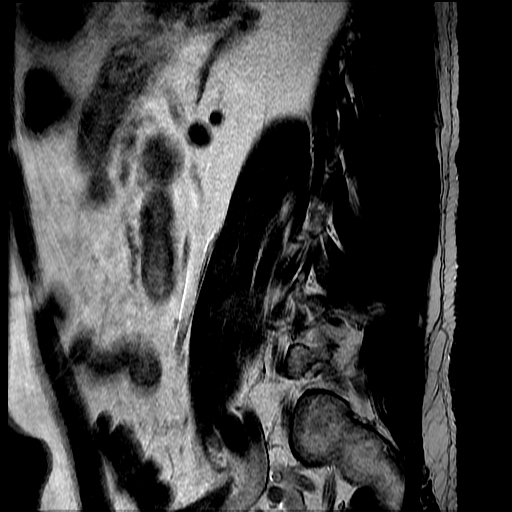

[Series 6: T2 · axial · 4.0mm · 0.39mm/px · z∈[-51,+128]mm · 5 of 36 slices shown]
[im 1/36]
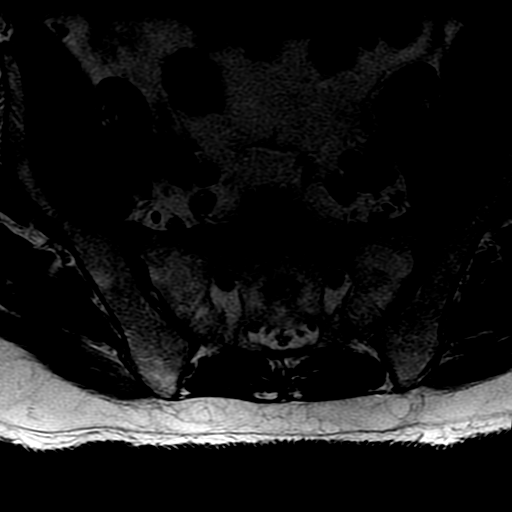
[im 6/36]
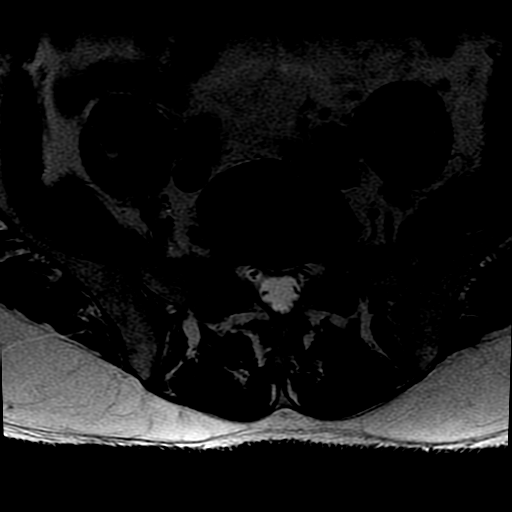
[im 11/36]
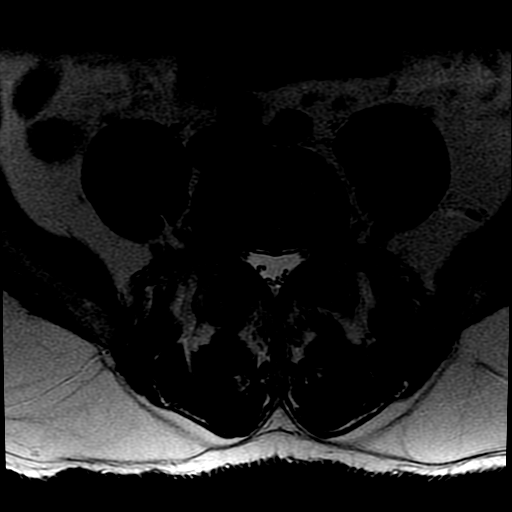
[im 18/36]
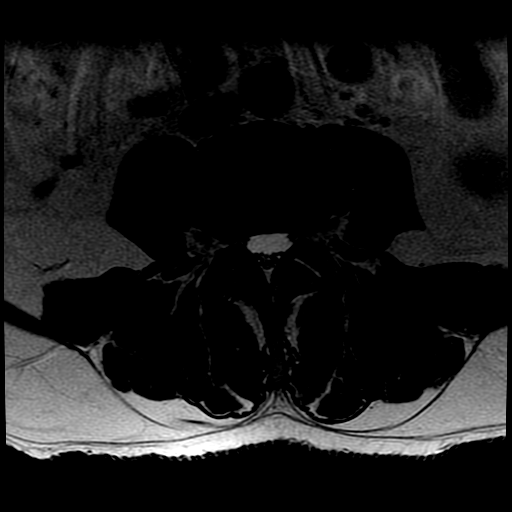
[im 31/36]
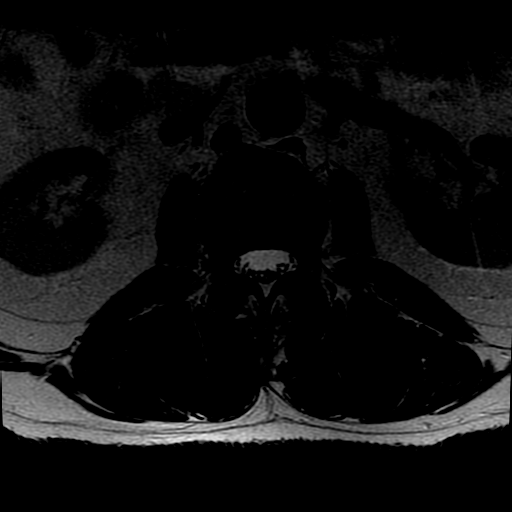

[Series 7: T1 · axial · 4.0mm · 0.39mm/px · z∈[-26,+128]mm · 3 of 36 slices shown (2 of 2)]
[im 6/36]
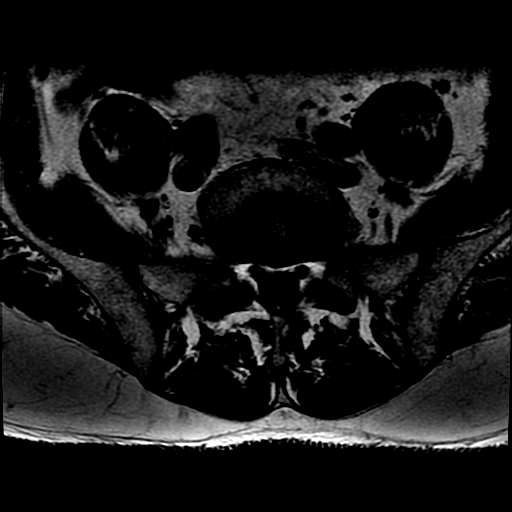
[im 18/36]
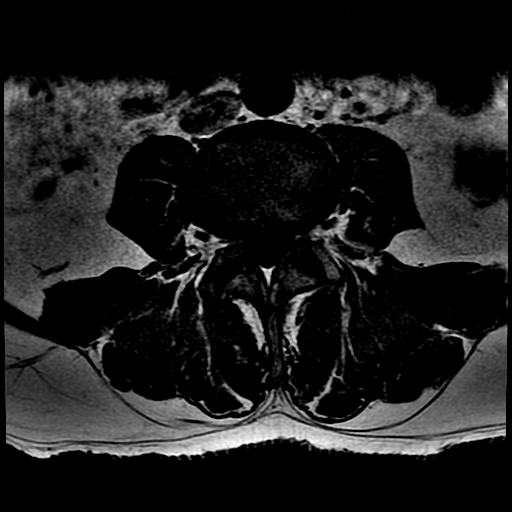
[im 31/36]
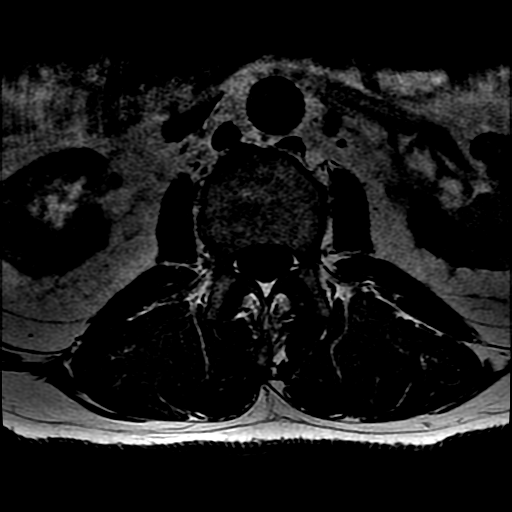

[17 of 48 positions shown; findings below may reference images not displayed]

FINDINGS: Segmentation: Normal segmentation. Lowest well-formed disc labeled
the L5-S1 level.

Alignment: Vertebral bodies normally aligned with preservation of
the normal lumbar lordosis. No listhesis or subluxation.

Vertebrae: Vertebral body heights well maintained. No evidence for
acute or chronic fracture. Bone marrow signal intensity within
normal limits. Small benign hemangioma noted within the T12
vertebral body. No worrisome osseous lesions. No abnormal marrow
edema.

Conus medullaris: Extends to the T12 level and appears normal.

Paraspinal and other soft tissues: Paraspinous soft tissues within
normal limits. Visualized visceral structures are normal.

Disc levels:

T12-L1:  Unremarkable.

L1-2:  Unremarkable.

L2-3: Mild diffuse disc bulge with disc desiccation. Minimal
reactive endplate changes. There is a superimposed shallow left
extraforaminal disc protrusion with associated annular fissure
(series 6, image 11). Protruding disc closely approximates the
exiting left L2 nerve root without frank impingement. No canal
stenosis. Foramina remain widely patent.

L3-4: Mild annular disc bulge with associated
biforaminal/extraforaminal annular fissures. Mild facet and
ligamentum flavum hypertrophy. Bulging disc closely approximates the
exiting L3 nerve roots bilaterally without neural impingement.
Minimal bilateral subarticular stenosis without canal narrowing,
stable from previous. No significant foraminal encroachment.

L4-5: Diffuse disc bulge with disc desiccation. Mild reactive
endplate changes with marginal endplate osteophytosis, mildly
progressed from previous. There is a superimposed left subarticular
disc protrusion encroaching upon the left lateral recess, new from
previous (series 6, image 24). Protruding disc contacts and impinges
upon the descending left L5 nerve root as it courses through the
left lateral recess. Moderate facet and ligament flavum hypertrophy.
Resultant mild canal with moderate lateral recess stenosis
bilaterally, greater on the left. Mild left L4 foraminal stenosis,
worsened from previous. Changes have mildly progressed from
previous.

L5-S1: Mild disc bulge. Superimposed broad central disc protrusion
with associated annular fissure. Mild bilateral subarticular
stenosis without frank neural impingement. Central canal remains
patent. Mild facet and ligamentum flavum hypertrophy. Foramina are
patent.
IMPRESSION: 1. Left subarticular disc protrusion at L4-5, impinging upon the
left L5 nerve root as it courses through the left neural foramen.
2. Small central disc protrusion at L5-S1, closely approximating the
bilateral S1 nerve roots without frank neural impingement.
3. Left extraforaminal disc protrusion at L2-3, closely
approximating and potentially irritating the exiting left L2 nerve
root.
4. Progressive multifactorial degenerative changes at L4-5 with
resultant moderate bilateral subarticular stenosis and mild left L4
foraminal narrowing.

## 2018-12-20 ENCOUNTER — Ambulatory Visit: Payer: 59 | Admitting: *Deleted

## 2018-12-27 ENCOUNTER — Other Ambulatory Visit: Payer: Self-pay | Admitting: Family Medicine

## 2019-01-10 ENCOUNTER — Encounter: Payer: Self-pay | Admitting: Family Medicine

## 2019-03-14 ENCOUNTER — Encounter: Payer: Self-pay | Admitting: Family Medicine

## 2019-05-07 ENCOUNTER — Ambulatory Visit: Payer: Medicare HMO | Admitting: Family Medicine

## 2019-05-08 ENCOUNTER — Encounter: Payer: Self-pay | Admitting: Family Medicine

## 2019-05-08 ENCOUNTER — Other Ambulatory Visit: Payer: Self-pay | Admitting: Family Medicine

## 2019-05-18 ENCOUNTER — Telehealth: Payer: Self-pay

## 2019-05-18 NOTE — Telephone Encounter (Signed)
Questions for Screening COVID-19  Symptom onset: None  Travel or Contacts: None  During this illness, did/does the patient experience any of the following symptoms? Fever >100.53F []   Yes [x]   No []   Unknown Subjective fever (felt feverish) []   Yes [x]   No []   Unknown Chills []   Yes [x]   No []   Unknown Muscle aches (myalgia) []   Yes [x]   No []   Unknown Runny nose (rhinorrhea) []   Yes [x]   No []   Unknown Sore throat []   Yes [x]   No []   Unknown Cough (new onset or worsening of chronic cough) []   Yes [x]   No []   Unknown Shortness of breath (dyspnea) []   Yes [x]   No []   Unknown Nausea or vomiting []   Yes [x]   No []   Unknown Headache []   Yes [x]   No []   Unknown Abdominal pain  []   Yes [x]   No []   Unknown Diarrhea (?3 loose/looser than normal stools/24hr period) []   Yes [x]   No []   Unknown Other, specify:  Patient risk factors: Smoker? []   Current []   Former []   Never If male, currently pregnant? []   Yes []   No  Patient Active Problem List   Diagnosis Date Noted  . New onset type 2 diabetes mellitus ([place]) 11/15/2017  . Erectile dysfunction 04/05/2016  . Eosinophilic esophagitis 13/88/7195  . GERD (gastroesophageal reflux disease) 09/19/2014    Plan:  []   High risk for COVID-19 with red flags go to ED (with CP, SOB, weak/lightheaded, or fever > 101.5). Call ahead.  []   High risk for COVID-19 but stable. Inform provider and coordinate time for [facility] visit.   []   No red flags but URI signs or symptoms okay for [facility] visit.

## 2019-05-19 DIAGNOSIS — Z0000 Encounter for general adult medical examination without abnormal findings: Secondary | ICD-10-CM | POA: Insufficient documentation

## 2019-05-19 NOTE — Progress Notes (Signed)
Subjective:   Patient ID: Alex Clark, male    DOB: Feb 24, 1951, 68 y.o.   MRN: [id number]  Alex Clark is a pleasant 68 y.o. year old male who presents to clinic today with welcome to medicare (Pt screened at vehicle. He is here for a [facility] to [facility] PE.  He is fasting.  He is going to make an appt to get eyes examined.) and Sciatica (He is C/O LBP that normally goes down right leg but has started going down left leg to mid calf.  He has seen Dr. Lorelei Pont for an injection which was ineffective. OTC meds are ineffective. Sometimes cannot even walk.)  on 05/21/2019  HPI:  I have personally reviewed the Medicare Annual Wellness questionnaire and have noted 1. The patient's medical and social history 2. Their use of alcohol, tobacco or illicit drugs 3. Their current medications and supplements 4. The patient's functional ability including ADL's, fall risks, home safety risks and hearing or visual             impairment. 5. Diet and physical activities 6. Evidence for depression or mood disorders  End of life wishes discussed and updated in Social History.  Health Maintenance  Topic Date Due  . OPHTHALMOLOGY EXAM  10/03/1963  . URINE MICROALBUMIN  10/03/1963  . HEMOGLOBIN A1C  10/05/2018  . INFLUENZA VACCINE  05/28/2019 (Originally 05/05/2019)  . FOOT EXAM  05/20/2020  . PNA vac Low Risk Adult (2 of 2 - PPSV23) 05/20/2020  . COLONOSCOPY  09/04/2024  . TETANUS/TDAP  11/02/2027  . Hepatitis C Screening  Completed  . HIV Screening  Completed   Colonoscopy completed by Dr. Carlean Purl on 09/04/14- reviewed- 10 year recall.  DM- no longer taking metformin, now diet controlled.  Does not check FSBS regularly.  Denies any episodes of hypoglycemia.   Lab Results  Component Value Date   HGBA1C 6.0 04/04/2018   Lab Results  Component Value Date   CHOL 229 (H) 11/01/2017   HDL 46.10 11/01/2017   LDLCALC 156 (H) 11/01/2017   LDLDIRECT 166.0 04/05/2016   TRIG 136.0 11/01/2017   CHOLHDL 5 11/01/2017    Known progressive DDD of lumbar spine- symptoms worsening- sciatic, left> right keeping him up at night.  MRI lumbar spine from 08/2017 showed-    IMPRESSION: 1. Left subarticular disc protrusion at L4-5, impinging upon the left L5 nerve root as it courses through the left neural foramen. 2. Small central disc protrusion at L5-S1, closely approximating the bilateral S1 nerve roots without frank neural impingement. 3. Left extraforaminal disc protrusion at L2-3, closely approximating and potentially irritating the exiting left L2 nerve root. 4. Progressive multifactorial degenerative changes at L4-5 with resultant moderate bilateral subarticular stenosis and mild left L4 foraminal narrowing.   Electronically Signed   By: Jeannine Boga M.D.   On: 08/10/2017 21:55 Current Outpatient Medications on File Prior to Visit  Medication Sig Dispense Refill  . acetaminophen (TYLENOL) 325 MG tablet     . ibuprofen (ADVIL) 200 MG tablet Take 200 mg by mouth every 6 (six) hours as needed.    Marland Kitchen omeprazole (PRILOSEC) 40 MG capsule TAKE 1 CAPSULE BY MOUTH DAILY BEFORE BREAKFAST 90 capsule 0   No current facility-administered medications on file prior to visit.     Allergies  Allergen Reactions  . Pantoprazole Sodium Other (See Comments)    Headache    Past Medical History:  Diagnosis Date  . Diverticulosis   . Eosinophilic esophagitis   .  GERD (gastroesophageal reflux disease)     Past Surgical History:  Procedure Laterality Date  . COLONOSCOPY    . ESOPHAGOGASTRODUODENOSCOPY    . KNEE ARTHROSCOPY Left 2012    Family History  Problem Relation Age of Onset  . Heart disease Mother   . Hypertension Mother   . Hypertension Father   . Cancer Sister   . Cancer Maternal Uncle   . Colon cancer Neg Hx     Social History   Socioeconomic History  . Marital status: Single    Spouse name: Not on file  . Number of children: Not on file  . Years  of education: Not on file  . Highest education level: Not on file  Occupational History  . Not on file  Social Needs  . Financial resource strain: Not on file  . Food insecurity    Worry: Not on file    Inability: Not on file  . Transportation needs    Medical: Not on file    Non-medical: Not on file  Tobacco Use  . Smoking status: Never Smoker  . Smokeless tobacco: Never Used  Substance and Sexual Activity  . Alcohol use: No    Alcohol/week: 0.0 standard drinks  . Drug use: No  . Sexual activity: Yes  Lifestyle  . Physical activity    Days per week: Not on file    Minutes per session: Not on file  . Stress: Not on file  Relationships  . Social Herbalist on phone: Not on file    Gets together: Not on file    Attends religious service: Not on file    Active member of club or organization: Not on file    Attends meetings of clubs or organizations: Not on file    Relationship status: Not on file  . Intimate partner violence    Fear of current or ex partner: Not on file    Emotionally abused: Not on file    Physically abused: Not on file    Forced sexual activity: Not on file  Other Topics Concern  . Not on file  Social History Narrative   Archivist   Divorced- two children- son lives in [place], [place] an daughter lives in [place]- she has two sons.      Does not have a living will or HPOA- does not want life support if futile.   The PMH, PSH, Social History, Family History, Medications, and allergies have been reviewed in [facility], and have been updated if relevant.   Review of Systems  Constitutional: Negative.   HENT: Negative.   Eyes: Negative.   Respiratory: Negative.   Cardiovascular: Negative.   Gastrointestinal: Negative.   Endocrine: Negative.   Genitourinary: Negative.   Musculoskeletal: Negative.   Skin: Negative.   Allergic/Immunologic: Negative.   Neurological: Negative.   Hematological: Negative.   Psychiatric/Behavioral: Negative.   All  other systems reviewed and are negative.      Objective:    BP 132/76   Pulse 60   Temp 98.7 F (37.1 C)   Ht 5\' 9"  (1.753 m)   Wt 183 lb 6.4 oz (83.2 kg)   BMI 27.08 kg/m    Physical Exam Vitals signs and nursing note reviewed.  Constitutional:      General: He is not in acute distress.    Appearance: Normal appearance. He is not ill-appearing.  HENT:     Head: Normocephalic and atraumatic.     Right Ear: Tympanic  membrane and ear canal normal.     Left Ear: Tympanic membrane and ear canal normal.     Nose: Nose normal.     Mouth/Throat:     Mouth: Mucous membranes are moist.  Eyes:     Extraocular Movements: Extraocular movements intact.  Neck:     Musculoskeletal: Normal range of motion.  Cardiovascular:     Rate and Rhythm: Normal rate.     Pulses: Normal pulses.  Pulmonary:     Effort: Pulmonary effort is normal.     Breath sounds: Normal breath sounds.  Musculoskeletal: Normal range of motion.     Right lower leg: No edema.     Left lower leg: No edema.  Skin:    General: Skin is warm and dry.  Neurological:     General: No focal deficit present.     Mental Status: He is alert and oriented to person, place, and time. Mental status is at baseline.  Psychiatric:        Mood and Affect: Mood normal.        Behavior: Behavior normal.        Thought Content: Thought content normal.        Judgment: Judgment normal.           Assessment & Plan:   Controlled type 2 diabetes mellitus without complication, without long-term current use of insulin (HCC) - Plan: Hemoglobin A1c, Lipid panel, PSA, Microalbumin / creatinine urine ratio, TSH, EKG 12-Lead,   Welcome to Medicare preventive visit - Plan: EKG 12-Lead,  No follow-ups on file.

## 2019-05-21 ENCOUNTER — Ambulatory Visit (INDEPENDENT_AMBULATORY_CARE_PROVIDER_SITE_OTHER): Payer: Medicare HMO | Admitting: Family Medicine

## 2019-05-21 ENCOUNTER — Encounter: Payer: Self-pay | Admitting: Family Medicine

## 2019-05-21 VITALS — BP 132/76 | HR 60 | Temp 98.7°F | Ht 69.0 in | Wt 183.4 lb

## 2019-05-21 DIAGNOSIS — Z0000 Encounter for general adult medical examination without abnormal findings: Secondary | ICD-10-CM

## 2019-05-21 DIAGNOSIS — Z23 Encounter for immunization: Secondary | ICD-10-CM

## 2019-05-21 DIAGNOSIS — E119 Type 2 diabetes mellitus without complications: Secondary | ICD-10-CM | POA: Diagnosis not present

## 2019-05-21 DIAGNOSIS — M5137 Other intervertebral disc degeneration, lumbosacral region: Secondary | ICD-10-CM

## 2019-05-21 LAB — PSA: PSA: 1.29 ng/mL (ref 0.10–4.00)

## 2019-05-21 LAB — MICROALBUMIN / CREATININE URINE RATIO
Creatinine,U: 100.2 mg/dL
Microalb Creat Ratio: 0.7 mg/g (ref 0.0–30.0)
Microalb, Ur: <0.7 mg/dL (ref 0.0–1.9)

## 2019-05-21 LAB — TSH: TSH: 1.04 u[IU]/mL (ref 0.35–4.50)

## 2019-05-21 LAB — LIPID PANEL
Cholesterol: 197 mg/dL (ref 0–200)
HDL: 42.1 mg/dL (ref >39.00)
NonHDL: 154.52
Total CHOL/HDL Ratio: 5
Triglycerides: 202 mg/dL — ABNORMAL HIGH (ref 0.0–149.0)
VLDL: 40.4 mg/dL — ABNORMAL HIGH (ref 0.0–40.0)

## 2019-05-21 LAB — LDL CHOLESTEROL, DIRECT: Direct LDL: 127 mg/dL

## 2019-05-21 LAB — HEMOGLOBIN A1C: Hgb A1c MFr Bld: 6.1 % (ref 4.6–6.5)

## 2019-05-21 NOTE — Patient Instructions (Addendum)
Great to see you. I will call you with your lab results from today and you can view them online.   Talk to your family about your end of life wishes.  Please call to schedule your eye exam.  We are referring you to Dr. Ronnald Ramp.

## 2019-05-21 NOTE — Assessment & Plan Note (Addendum)
Diet controlled. Labs due today. Also given prevnar 13 today. Orders Placed This Encounter  Procedures  . Hemoglobin A1c  . Lipid panel  . PSA  . Microalbumin / creatinine urine ratio  . TSH  . EKG 12-Lead

## 2019-05-21 NOTE — Assessment & Plan Note (Addendum)
The patients weight, height, BMI and visual acuity have been recorded in the chart.  Cognitive function assessed.   I have made referrals, counseling and provided education to the patient based review of the above and I have provided the pt with a written personalized care plan for preventive services.  EKG reviewed by me- NSR.

## 2019-05-21 NOTE — Assessment & Plan Note (Signed)
Progressive symptoms.  Will refer to neurosurgery.  See MRI from 08/10/17. He requests Dr. Ronnald Ramp. Referral placed.

## 2019-06-21 DIAGNOSIS — R030 Elevated blood-pressure reading, without diagnosis of hypertension: Secondary | ICD-10-CM | POA: Diagnosis not present

## 2019-06-21 DIAGNOSIS — M5441 Lumbago with sciatica, right side: Secondary | ICD-10-CM | POA: Diagnosis not present

## 2019-06-21 DIAGNOSIS — Z6826 Body mass index (BMI) 26.0-26.9, adult: Secondary | ICD-10-CM | POA: Diagnosis not present

## 2019-07-03 DIAGNOSIS — M48061 Spinal stenosis, lumbar region without neurogenic claudication: Secondary | ICD-10-CM | POA: Diagnosis not present

## 2019-07-03 DIAGNOSIS — M5441 Lumbago with sciatica, right side: Secondary | ICD-10-CM | POA: Diagnosis not present

## 2019-07-03 DIAGNOSIS — M5127 Other intervertebral disc displacement, lumbosacral region: Secondary | ICD-10-CM | POA: Diagnosis not present

## 2019-07-05 DIAGNOSIS — M5416 Radiculopathy, lumbar region: Secondary | ICD-10-CM | POA: Insufficient documentation

## 2019-07-06 DIAGNOSIS — H524 Presbyopia: Secondary | ICD-10-CM | POA: Diagnosis not present

## 2019-07-11 DIAGNOSIS — Z1159 Encounter for screening for other viral diseases: Secondary | ICD-10-CM | POA: Diagnosis not present

## 2019-07-18 DIAGNOSIS — M5126 Other intervertebral disc displacement, lumbar region: Secondary | ICD-10-CM | POA: Diagnosis not present

## 2019-07-18 DIAGNOSIS — M48062 Spinal stenosis, lumbar region with neurogenic claudication: Secondary | ICD-10-CM | POA: Diagnosis not present

## 2019-07-18 DIAGNOSIS — M5416 Radiculopathy, lumbar region: Secondary | ICD-10-CM | POA: Diagnosis not present

## 2019-07-18 DIAGNOSIS — M5116 Intervertebral disc disorders with radiculopathy, lumbar region: Secondary | ICD-10-CM | POA: Diagnosis not present

## 2019-08-21 DIAGNOSIS — M25551 Pain in right hip: Secondary | ICD-10-CM | POA: Diagnosis not present

## 2019-08-21 DIAGNOSIS — M25552 Pain in left hip: Secondary | ICD-10-CM | POA: Diagnosis not present

## 2019-08-21 DIAGNOSIS — M5416 Radiculopathy, lumbar region: Secondary | ICD-10-CM | POA: Diagnosis not present

## 2019-08-22 DIAGNOSIS — M25552 Pain in left hip: Secondary | ICD-10-CM | POA: Diagnosis not present

## 2019-09-10 ENCOUNTER — Other Ambulatory Visit: Payer: Self-pay | Admitting: Family Medicine

## 2019-09-24 DIAGNOSIS — M1612 Unilateral primary osteoarthritis, left hip: Secondary | ICD-10-CM | POA: Diagnosis present

## 2019-09-24 NOTE — H&P (Addendum)
HIP ARTHROPLASTY ADMISSION H&P  Patient ID: Alex Clark MRN: [id number] DOB/AGE: 01-31-1951 68 y.o.  Chief Complaint: left hip pain.  Planned Procedure Date: 10/16/2019 Medical Clearance by Dr. Arnette Norris   HPI: CLAYVON ALPERN is a 68 y.o. male with a history of diet controlled DM, GERD, and lumbar DJD who presents for evaluation of OA LEFT HIP. The patient has a history of pain and functional disability in the left hip due to arthritis and has failed non-surgical conservative treatments for greater than 12 weeks to include NSAID's and/or analgesics, activity modification and injections.  Onset of symptoms was gradual, starting 2 years ago with gradually worsening course since that time. Patient currently rates pain at 8 out of 10 with activity. Patient has night pain, worsening of pain with activity and weight bearing and pain that interferes with activities of daily living.  Patient has evidence of periarticular osteophytes, joint space narrowing and Bilateral cam deformities by imaging studies.  There is no active infection.  Past Medical History:  Diagnosis Date  . Diverticulosis   . Eosinophilic esophagitis   . GERD (gastroesophageal reflux disease)    Past Surgical History:  Procedure Laterality Date  . COLONOSCOPY    . ESOPHAGOGASTRODUODENOSCOPY    . KNEE ARTHROSCOPY Left 2012   Allergies  Allergen Reactions  . Pantoprazole Sodium Other (See Comments)    Headache   Prior to Admission medications   Medication Sig Start Date End Date Taking? Authorizing Provider  acetaminophen (TYLENOL) 325 MG tablet  04/13/17   [provider]  ibuprofen (ADVIL) 200 MG tablet Take 200 mg by mouth every 6 (six) hours as needed.    [provider]  omeprazole (PRILOSEC) 40 MG capsule TAKE 1 CAPSULE BY MOUTH DAILY BEFORE BREAKFAST 09/10/19   Lucille Passy, MD   Social History   Socioeconomic History  . Marital status: Single    Spouse name: Not on file  . Number of  children: Not on file  . Years of education: Not on file  . Highest education level: Not on file  Occupational History  . Not on file  Tobacco Use  . Smoking status: Never Smoker  . Smokeless tobacco: Never Used  Substance and Sexual Activity  . Alcohol use: No    Alcohol/week: 0.0 standard drinks  . Drug use: No  . Sexual activity: Yes  Other Topics Concern  . Not on file  Social History Narrative   Archivist   Divorced- two children- son lives in [place], [place] an daughter lives in [place]- she has two sons.      Does not have a living will or HPOA- does not want life support if futile.   Social Determinants of Health   Financial Resource Strain:   . Difficulty of Paying Living Expenses: Not on file  Food Insecurity:   . Worried About Charity fundraiser in the Last Year: Not on file  . Ran Out of Food in the Last Year: Not on file  Transportation Needs:   . Lack of Transportation (Medical): Not on file  . Lack of Transportation (Non-Medical): Not on file  Physical Activity:   . Days of Exercise per Week: Not on file  . Minutes of Exercise per Session: Not on file  Stress:   . Feeling of Stress : Not on file  Social Connections:   . Frequency of Communication with Friends and Family: Not on file  . Frequency of Social Gatherings with Friends and  Family: Not on file  . Attends Religious Services: Not on file  . Active Member of Clubs or Organizations: Not on file  . Attends Archivist Meetings: Not on file  . Marital Status: Not on file   Family History  Problem Relation Age of Onset  . Heart disease Mother   . Hypertension Mother   . Hypertension Father   . Cancer Sister   . Cancer Maternal Uncle   . Colon cancer Neg Hx     ROS: Currently denies lightheadedness, dizziness, Fever, chills, CP, SOB.   No personal history of DVT, PE, MI, or CVA. No loose teeth or dentures All other systems have been reviewed and were otherwise currently negative with the  exception of those mentioned in the HPI and as above.  Objective: Vitals: Ht: 5'10" Wt: 186 Temp: 97.8 BP: 129/78 Pulse: 61 O2 98% on room air.   Physical Exam: General: Alert, NAD. Trendelenberg Gait  HEENT: EOMI, Good Neck Extension  Pulm: No increased work of breathing.  Clear B/L A/P w/o crackle or wheeze.  CV: RRR, No m/g/r appreciated  GI: soft, NT, ND Neuro: Neuro without gross focal deficit.  Sensation intact distally Skin: No lesions in the area of chief complaint MSK/Surgical Site: Left Hip pain with passive ROM.  Positive Stinchfield.  5/5 strength.  NVI.    Imaging Review Plain radiographs demonstrate severe degenerative joint disease of both hip joints.  Preoperative templating of the joint replacement has been completed, documented, and submitted to the Operating Room personnel in order to optimize intra-operative equipment management.  Assessment: OA LEFT HIP Principal Problem:   Primary osteoarthritis of left hip Active Problems:   GERD (gastroesophageal reflux disease)   Diabetes mellitus type 2, diet-controlled ([place])   Plan: Plan for Procedure(s): TOTAL HIP ARTHROPLASTY ANTERIOR APPROACH  The patient history, physical exam, clinical judgement of the provider and imaging are consistent with end stage degenerative joint disease and total joint arthroplasty is deemed medically necessary. The treatment options including medical management, injection therapy, and arthroplasty were discussed at length. The risks and benefits of Procedure(s): TOTAL HIP ARTHROPLASTY ANTERIOR APPROACH were presented and reviewed.  The risks of nonoperative treatment, versus surgical intervention including but not limited to continued pain, aseptic loosening, stiffness, dislocation/subluxation, infection, bleeding, nerve injury, blood clots, cardiopulmonary complications, morbidity, mortality, among others were discussed. The patient verbalizes understanding and wishes to proceed with the  plan.  Patient is being admitted for surgery, pain control, PT, prophylactic antibiotics, VTE prophylaxis, progressive ambulation, ADL's and discharge planning.   Dental prophylaxis discussed and recommended for 2 years postoperatively.   The patient does meet the criteria for TXA which will be used perioperatively.   He has a goal of Same day discharge.   ASA 81 mg BID will be used postoperatively for DVT prophylaxis in addition to SCDs, and early ambulation.  Plan for Norco, 100 mg Celebrex.  Baclofen for spasm.  Continue prilosec for gastric protection.  The patient is planning to be discharged home with HHPT ([facility]) in care of Odella Aquas: [phone].   Patient's anticipated LOS is less than 2 midnights, meeting these requirements: - Lives within 1 hour of care - Has a competent adult at home to recover with post-op recover - NO history of  - Chronic pain requiring opiods  - Coronary Artery Disease  - Heart failure  - Heart attack  - Stroke  - DVT/VTE  - Cardiac arrhythmia  - Respiratory Failure/COPD  - Renal failure  -  Anemia  - Advanced Liver disease    Prudencio Burly III, PA-C 09/24/2019 8:15 AM

## 2019-10-08 ENCOUNTER — Other Ambulatory Visit (HOSPITAL_COMMUNITY): Payer: Self-pay | Admitting: *Deleted

## 2019-10-08 NOTE — Patient Instructions (Addendum)
DUE TO COVID-19 ONLY ONE VISITOR IS ALLOWED TO COME WITH YOU AND STAY IN THE WAITING ROOM ONLY DURING PRE OP AND PROCEDURE DAY OF SURGERY. THE 1 VISITOR MAY VISIT WITH YOU AFTER SURGERY IN YOUR PRIVATE ROOM DURING VISITING HOURS ONLY!  YOU NEED TO HAVE A COVID 19 TEST ON__Friday 01/08/2021_____ @___  1255 pm____, THIS TEST MUST BE DONE BEFORE SURGERY, COME  [facility] [place] , [address].  ([place]) ONCE YOUR COVID TEST IS COMPLETED, PLEASE BEGIN THE QUARANTINE INSTRUCTIONS AS OUTLINED IN YOUR HANDOUT.                Alex Clark     Your procedure is scheduled on: Tuesday 10/16/2019   Report to [facility] Main  Entrance    Report to admitting at   0735 AM     Call this number if you have problems the morning of surgery [phone]    Remember: Do not eat food  :After Midnight.    NO SOLID FOOD AFTER MIDNIGHT THE NIGHT PRIOR TO SURGERY. NOTHING BY MOUTH EXCEPT CLEAR LIQUIDS UNTIL  0705 am .     PLEASE FINISH Gatorade G 2  DRINK PER SURGEON ORDER  WHICH NEEDS TO BE COMPLETED AT  0705 am .   CLEAR LIQUID DIET   Foods Allowed                                                                     Foods Excluded  Coffee and tea, regular and decaf                             liquids that you cannot  Plain Jell-O any favor except red or purple                                           see through such as: Fruit ices (not with fruit pulp)                                     milk, soups, orange juice  Iced Popsicles                                    All solid food Carbonated beverages, regular and diet                                    Cranberry, grape and apple juices Sports drinks like Gatorade Lightly seasoned clear broth or consume(fat free) Sugar, honey syrup  Sample Menu Breakfast                                Lunch  Supper Cranberry juice                    Beef broth                             Chicken broth Jell-O                                     Grape juice                           Apple juice Coffee or tea                        Jell-O                                      Popsicle                                                Coffee or tea                        Coffee or tea  _____________________________________________________________________    BRUSH YOUR TEETH MORNING OF SURGERY AND RINSE YOUR MOUTH OUT, NO CHEWING GUM CANDY OR MINTS.     Take these medicines the morning of surgery with A SIP OF WATER: Omeprazole (Prilosec)   DO NOT TAKE ANY DIABETIC MEDICATIONS DAY OF YOUR SURGERY!                               You may not have any metal on your body including hair pins and              piercings  Do not wear jewelry, make-up, lotions, powders or perfumes, deodorant                         Men may shave face and neck.   Do not bring valuables to the hospital. [place].  Contacts, dentures or bridgework may not be worn into surgery.  Leave suitcase in the car. After surgery it may be brought to your room.     Patients discharged the day of surgery will not be allowed to drive home. IF YOU ARE HAVING SURGERY AND GOING HOME THE SAME DAY, YOU MUST HAVE AN ADULT TO DRIVE YOU HOME AND  BE WITH YOU FOR 24 HOURS. YOU MAY GO HOME BY TAXI OR UBER OR ORTHERWISE, BUT AN ADULT MUST ACCOMPANY YOU HOME AND STAY WITH YOU FOR 24 HOURS.  Name and phone number of your driver:friend- Odella Aquas  [phone]                Please read over the following fact sheets you were given: _____________________________________________________________________             [facility] - Preparing for Surgery Before surgery, you can play an important  role.  Because skin is not sterile, your skin needs to be as free of germs as possible.  You can reduce the number of germs on your skin by washing with CHG (chlorahexidine gluconate)  soap before surgery.  CHG is an antiseptic cleaner which kills germs and bonds with the skin to continue killing germs even after washing. Please DO NOT use if you have an allergy to CHG or antibacterial soaps.  If your skin becomes reddened/irritated stop using the CHG and inform your nurse when you arrive at Short Stay. Do not shave (including legs and underarms) for at least 48 hours prior to the first CHG shower.  You may shave your face/neck. Please follow these instructions carefully:  1.  Shower with CHG Soap the night before surgery and the  morning of Surgery.  2.  If you choose to wash your hair, wash your hair first as usual with your  normal  shampoo.  3.  After you shampoo, rinse your hair and body thoroughly to remove the  shampoo.                           4.  Use CHG as you would any other liquid soap.  You can apply chg directly  to the skin and wash                       Gently with a scrungie or clean washcloth.  5.  Apply the CHG Soap to your body ONLY FROM THE NECK DOWN.   Do not use on face/ open                           Wound or open sores. Avoid contact with eyes, ears mouth and genitals (private parts).                       Wash face,  Genitals (private parts) with your normal soap.             6.  Wash thoroughly, paying special attention to the area where your surgery  will be performed.  7.  Thoroughly rinse your body with warm water from the neck down.  8.  DO NOT shower/wash with your normal soap after using and rinsing off  the CHG Soap.                9.  Pat yourself dry with a clean towel.            10.  Wear clean pajamas.            11.  Place clean sheets on your bed the night of your first shower and do not  sleep with pets. Day of Surgery : Do not apply any lotions/deodorants the morning of surgery.  Please wear clean clothes to the hospital/surgery center.  FAILURE TO FOLLOW THESE INSTRUCTIONS MAY RESULT IN THE CANCELLATION OF YOUR SURGERY PATIENT  SIGNATURE_________________________________  NURSE SIGNATURE__________________________________  ________________________________________________________________________   Alex Clark  An incentive spirometer is a tool that can help keep your lungs clear and active. This tool measures how well you are filling your lungs with each breath. Taking long deep breaths may help reverse or decrease the chance of developing breathing (pulmonary) problems (especially infection) following:  A long period of time when you are unable to move or be active. BEFORE THE PROCEDURE  If the spirometer includes an indicator to show your best effort, your nurse or respiratory therapist will set it to a desired goal.  If possible, sit up straight or lean slightly forward. Try not to slouch.  Hold the incentive spirometer in an upright position. INSTRUCTIONS FOR USE  1. Sit on the edge of your bed if possible, or sit up as far as you can in bed or on a chair. 2. Hold the incentive spirometer in an upright position. 3. Breathe out normally. 4. Place the mouthpiece in your mouth and seal your lips tightly around it. 5. Breathe in slowly and as deeply as possible, raising the piston or the ball toward the top of the column. 6. Hold your breath for 3-5 seconds or for as long as possible. Allow the piston or ball to fall to the bottom of the column. 7. Remove the mouthpiece from your mouth and breathe out normally. 8. Rest for a few seconds and repeat Steps 1 through 7 at least 10 times every 1-2 hours when you are awake. Take your time and take a few normal breaths between deep breaths. 9. The spirometer may include an indicator to show your best effort. Use the indicator as a goal to work toward during each repetition. 10. After each set of 10 deep breaths, practice coughing to be sure your lungs are clear. If you have an incision (the cut made at the time of surgery), support your incision when coughing  by placing a pillow or rolled up towels firmly against it. Once you are able to get out of bed, walk around indoors and cough well. You may stop using the incentive spirometer when instructed by your caregiver.  RISKS AND COMPLICATIONS  Take your time so you do not get dizzy or light-headed.  If you are in pain, you may need to take or ask for pain medication before doing incentive spirometry. It is harder to take a deep breath if you are having pain. AFTER USE  Rest and breathe slowly and easily.  It can be helpful to keep track of a log of your progress. Your caregiver can provide you with a simple table to help with this. If you are using the spirometer at home, follow these instructions: [place] IF:   You are having difficultly using the spirometer.  You have trouble using the spirometer as often as instructed.  Your pain medication is not giving enough relief while using the spirometer.  You develop fever of 100.5 F (38.1 C) or higher. SEEK IMMEDIATE MEDICAL CARE IF:   You cough up bloody sputum that had not been present before.  You develop fever of 102 F (38.9 C) or greater.  You develop worsening pain at or near the incision site. MAKE SURE YOU:   Understand these instructions.  Will watch your condition.  Will get help right away if you are not doing well or get worse. Document Released: 01/31/2007 Document Revised: 12/13/2011 Document Reviewed: 04/03/2007 [facility] Patient Information 2014 [place], [place].   ________________________________________________________________________

## 2019-10-09 ENCOUNTER — Encounter (HOSPITAL_COMMUNITY): Payer: Self-pay

## 2019-10-09 ENCOUNTER — Other Ambulatory Visit: Payer: Self-pay

## 2019-10-09 ENCOUNTER — Encounter (HOSPITAL_COMMUNITY)
Admission: RE | Admit: 2019-10-09 | Discharge: 2019-10-09 | Disposition: A | Discharge status: Home / Self Care | Payer: Medicare HMO | Source: Ambulatory Visit | Attending: Orthopedic Surgery | Admitting: Orthopedic Surgery

## 2019-10-09 DIAGNOSIS — Z01818 Encounter for other preprocedural examination: Secondary | ICD-10-CM | POA: Diagnosis not present

## 2019-10-09 HISTORY — DX: Type 2 diabetes mellitus without complications: E11.9

## 2019-10-09 NOTE — Progress Notes (Signed)
PCP - Dr. Arnette Norris Cardiologist - n/a  Chest x-ray - n/a EKG - 05/21/2019 EPIC Stress Test - n/a ECHO - n/a Cardiac Cath -n/a   Sleep Study - n/a CPAP - n/a  Fasting Blood Sugar - diet controlled Diabetes- does not check glucose Checks Blood Sugar ___0__ times a day  Blood Thinner Instructions:n/a Aspirin Instructions:n/a Last Dose:Advil as needed last dose 10/06/2019  Anesthesia review:   Patient has a history of DM and GERD.  Patient denies shortness of breath, fever, cough and chest pain at PAT appointment   Patient verbalized understanding of instructions that were given to them at the PAT appointment. Patient was also instructed that they will need to review over the PAT instructions again at home before surgery.

## 2019-10-12 ENCOUNTER — Other Ambulatory Visit: Payer: Self-pay

## 2019-10-12 ENCOUNTER — Other Ambulatory Visit (HOSPITAL_COMMUNITY)
Admission: RE | Admit: 2019-10-12 | Discharge: 2019-10-12 | Disposition: A | Discharge status: Home / Self Care | Payer: Medicare HMO | Source: Ambulatory Visit | Attending: Orthopedic Surgery | Admitting: Orthopedic Surgery

## 2019-10-12 ENCOUNTER — Encounter (HOSPITAL_COMMUNITY)
Admission: RE | Admit: 2019-10-12 | Discharge: 2019-10-12 | Disposition: A | Discharge status: Home / Self Care | Payer: Medicare HMO | Source: Ambulatory Visit | Attending: Orthopedic Surgery | Admitting: Orthopedic Surgery

## 2019-10-12 DIAGNOSIS — Z01818 Encounter for other preprocedural examination: Secondary | ICD-10-CM | POA: Diagnosis not present

## 2019-10-12 LAB — BASIC METABOLIC PANEL
Anion gap: 9 (ref 5–15)
BUN: 15 mg/dL (ref 8–23)
CO2: 25 mmol/L (ref 22–32)
Calcium: 9 mg/dL (ref 8.9–10.3)
Chloride: 105 mmol/L (ref 98–111)
Creatinine, Ser: 0.9 mg/dL (ref 0.61–1.24)
GFR calc Af Amer: >60 mL/min (ref >60)
GFR calc non Af Amer: >60 mL/min (ref >60)
Glucose, Bld: 79 mg/dL (ref 70–99)
Potassium: 4.1 mmol/L (ref 3.5–5.1)
Sodium: 139 mmol/L (ref 135–145)

## 2019-10-12 LAB — SURGICAL PCR SCREEN
MRSA, PCR: NEGATIVE
Staphylococcus aureus: NEGATIVE

## 2019-10-12 LAB — GLUCOSE, CAPILLARY: Glucose-Capillary: 108 mg/dL — ABNORMAL HIGH (ref 70–99)

## 2019-10-12 LAB — CBC
HCT: 45.5 % (ref 39.0–52.0)
Hemoglobin: 14.9 g/dL (ref 13.0–17.0)
MCH: 30.1 pg (ref 26.0–34.0)
MCHC: 32.7 g/dL (ref 30.0–36.0)
MCV: 91.9 fL (ref 80.0–100.0)
Platelets: 216 10*3/uL (ref 150–400)
RBC: 4.95 MIL/uL (ref 4.22–5.81)
RDW: 13.3 % (ref 11.5–15.5)
WBC: 8.5 10*3/uL (ref 4.0–10.5)
nRBC: 0 % (ref 0.0–0.2)

## 2019-10-12 LAB — HEMOGLOBIN A1C
Hgb A1c MFr Bld: 5.8 % — ABNORMAL HIGH (ref 4.8–5.6)
Mean Plasma Glucose: 119.76 mg/dL

## 2019-10-13 LAB — NOVEL CORONAVIRUS, NAA (HOSP ORDER, SEND-OUT TO REF LAB; TAT 18-24 HRS): SARS-CoV-2, NAA: NOT DETECTED

## 2019-10-15 ENCOUNTER — Encounter (HOSPITAL_COMMUNITY): Payer: Self-pay | Admitting: Orthopedic Surgery

## 2019-10-15 MED ORDER — BUPIVACAINE LIPOSOME 1.3 % IJ SUSP
10.0000 mL | INTRAMUSCULAR | Status: DC
  Filled 2019-10-15: qty 10

## 2019-10-15 NOTE — Progress Notes (Signed)
Patient returned call back to get instructions for time change in surgery on 10/16/2019. Patient instructed to arrive at Short stay at 0530 am and to have clear liquids from midnight up until 0440 am when he drinks the Gatorade G 2 drink and then nothing else in the mouth until after surgery. Patient verbalized understanding.

## 2019-10-15 NOTE — Anesthesia Preprocedure Evaluation (Addendum)
Anesthesia Evaluation  Patient identified by MRN, date of birth, ID band Patient awake    Reviewed: Allergy & Precautions, NPO status , Patient's Chart, lab work & pertinent test results  History of Anesthesia Complications Negative for: history of anesthetic complications  Airway Mallampati: I  TM Distance: >3 FB Neck ROM: Full    Dental no notable dental hx.    Pulmonary neg pulmonary ROS,    Pulmonary exam normal        Cardiovascular negative cardio ROS Normal cardiovascular exam     Neuro/Psych negative neurological ROS  negative psych ROS   GI/Hepatic Neg liver ROS, GERD  Medicated and Controlled,  Endo/Other  diabetes, Type 2  Renal/GU negative Renal ROS  negative genitourinary   Musculoskeletal  (+) Arthritis ,   Abdominal   Peds  Hematology negative hematology ROS (+)   Anesthesia Other Findings Day of surgery medications reviewed with patient.  Reproductive/Obstetrics negative OB ROS                            Anesthesia Physical Anesthesia Plan  ASA: II  Anesthesia Plan: General   Post-op Pain Management:    Induction: Intravenous  PONV Risk Score and Plan: 3 and Treatment may vary due to age or medical condition, Ondansetron, Midazolam, Dexamethasone and TIVA  Airway Management Planned: Oral ETT  Additional Equipment: None  Intra-op Plan:   Post-operative Plan: Extubation in OR  Informed Consent: I have reviewed the patients History and Physical, chart, labs and discussed the procedure including the risks, benefits and alternatives for the proposed anesthesia with the patient or authorized representative who has indicated his/her understanding and acceptance.     Dental advisory given  Plan Discussed with: CRNA  Anesthesia Plan Comments:        Anesthesia Quick Evaluation

## 2019-10-15 NOTE — Care Plan (Signed)
Ortho Bundle Case Management Note  Patient Details  Name: Alex Clark MRN: [id number] Date of Birth: 06-05-1951  Spoke with patient prior to surgery. He lives alone but has friends to assist. HHPT referral to [facility]. OPPT set up with [place] walker ordered for home use from [place]. Patient and MD in agreement. Choice offered.                    DME Arranged:  [facility] rolling DME Agency:  [employer]  HH Arranged:  PT [place] Agency:  [facility] ([facility]  Additional Comments: Please contact me with any questions of if this plan should need to change.  Ladell Heads,  [place] [facility] Specialist  [phone] 10/15/2019, 10:19 AM

## 2019-10-16 ENCOUNTER — Encounter (HOSPITAL_COMMUNITY): Payer: Self-pay | Admitting: Orthopedic Surgery

## 2019-10-16 ENCOUNTER — Ambulatory Visit (HOSPITAL_COMMUNITY): Payer: Medicare HMO

## 2019-10-16 ENCOUNTER — Other Ambulatory Visit: Payer: Self-pay

## 2019-10-16 ENCOUNTER — Observation Stay (HOSPITAL_COMMUNITY)
Admission: RE | Admit: 2019-10-16 | Discharge: 2019-10-16 | Disposition: A | Discharge status: Home / Self Care | Payer: Medicare HMO | Attending: Orthopedic Surgery | Admitting: Orthopedic Surgery

## 2019-10-16 ENCOUNTER — Ambulatory Visit (HOSPITAL_COMMUNITY): Payer: Medicare HMO | Admitting: Physician Assistant

## 2019-10-16 ENCOUNTER — Ambulatory Visit (HOSPITAL_COMMUNITY): Payer: Medicare HMO | Admitting: Certified Registered Nurse Anesthetist

## 2019-10-16 ENCOUNTER — Encounter (HOSPITAL_COMMUNITY)
Admission: RE | Disposition: A | Discharge status: Home / Self Care | Payer: Self-pay | Source: Home / Self Care | Attending: Orthopedic Surgery

## 2019-10-16 DIAGNOSIS — Z96642 Presence of left artificial hip joint: Secondary | ICD-10-CM | POA: Diagnosis not present

## 2019-10-16 DIAGNOSIS — K219 Gastro-esophageal reflux disease without esophagitis: Secondary | ICD-10-CM | POA: Diagnosis present

## 2019-10-16 DIAGNOSIS — M1612 Unilateral primary osteoarthritis, left hip: Secondary | ICD-10-CM | POA: Diagnosis present

## 2019-10-16 DIAGNOSIS — Z471 Aftercare following joint replacement surgery: Secondary | ICD-10-CM | POA: Diagnosis not present

## 2019-10-16 DIAGNOSIS — E119 Type 2 diabetes mellitus without complications: Secondary | ICD-10-CM | POA: Diagnosis not present

## 2019-10-16 DIAGNOSIS — M160 Bilateral primary osteoarthritis of hip: Principal | ICD-10-CM | POA: Insufficient documentation

## 2019-10-16 DIAGNOSIS — M1610 Unilateral primary osteoarthritis, unspecified hip: Secondary | ICD-10-CM | POA: Diagnosis present

## 2019-10-16 DIAGNOSIS — Z79899 Other long term (current) drug therapy: Secondary | ICD-10-CM | POA: Diagnosis not present

## 2019-10-16 DIAGNOSIS — Z419 Encounter for procedure for purposes other than remedying health state, unspecified: Secondary | ICD-10-CM

## 2019-10-16 HISTORY — PX: TOTAL HIP ARTHROPLASTY: SHX124

## 2019-10-16 LAB — GLUCOSE, CAPILLARY: Glucose-Capillary: 125 mg/dL — ABNORMAL HIGH (ref 70–99)

## 2019-10-16 SURGERY — ARTHROPLASTY, HIP, TOTAL, ANTERIOR APPROACH
Anesthesia: General | Site: Hip | Laterality: Left

## 2019-10-16 MED ORDER — DEXAMETHASONE SODIUM PHOSPHATE 10 MG/ML IJ SOLN
INTRAMUSCULAR | Status: AC
  Filled 2019-10-16: qty 1

## 2019-10-16 MED ORDER — MIDAZOLAM HCL 2 MG/2ML IJ SOLN
INTRAMUSCULAR | Status: AC
  Filled 2019-10-16: qty 2

## 2019-10-16 MED ORDER — DIPHENHYDRAMINE HCL 12.5 MG/5ML PO ELIX
12.5000 mg | ORAL_SOLUTION | ORAL | Status: DC | PRN
  Filled 2019-10-16: qty 10

## 2019-10-16 MED ORDER — MORPHINE SULFATE (PF) 4 MG/ML IV SOLN
0.5000 mg | INTRAVENOUS | Status: DC | PRN
  Administered 2019-10-16: 1 mg via INTRAVENOUS

## 2019-10-16 MED ORDER — DOCUSATE SODIUM 100 MG PO CAPS
100.0000 mg | ORAL_CAPSULE | Freq: Two times a day (BID) | ORAL | Status: DC
  Filled 2019-10-16: qty 1

## 2019-10-16 MED ORDER — GLYCOPYRROLATE PF 0.2 MG/ML IJ SOSY
PREFILLED_SYRINGE | INTRAMUSCULAR | Status: AC
  Filled 2019-10-16: qty 1

## 2019-10-16 MED ORDER — HYDROMORPHONE HCL 1 MG/ML IJ SOLN
0.2500 mg | INTRAMUSCULAR | Status: DC | PRN
  Administered 2019-10-16 (×3): 0.5 mg via INTRAVENOUS

## 2019-10-16 MED ORDER — FENTANYL CITRATE (PF) 100 MCG/2ML IJ SOLN
INTRAMUSCULAR | Status: AC
  Filled 2019-10-16: qty 2

## 2019-10-16 MED ORDER — BUPIVACAINE LIPOSOME 1.3 % IJ SUSP
INTRAMUSCULAR | Status: DC | PRN
  Administered 2019-10-16: 10 mL

## 2019-10-16 MED ORDER — METHOCARBAMOL 500 MG IVPB - SIMPLE MED
500.0000 mg | Freq: Four times a day (QID) | INTRAVENOUS | Status: DC | PRN
  Administered 2019-10-16: 500 mg via INTRAVENOUS

## 2019-10-16 MED ORDER — CELECOXIB 200 MG PO CAPS
ORAL_CAPSULE | ORAL | Status: AC
  Filled 2019-10-16: qty 1

## 2019-10-16 MED ORDER — LIDOCAINE 2% (20 MG/ML) 5 ML SYRINGE
INTRAMUSCULAR | Status: DC | PRN
  Administered 2019-10-16: 40 mg via INTRAVENOUS
  Administered 2019-10-16: 60 mg via INTRAVENOUS

## 2019-10-16 MED ORDER — PROPOFOL 10 MG/ML IV BOLUS
INTRAVENOUS | Status: DC | PRN
  Administered 2019-10-16: 160 mg via INTRAVENOUS
  Administered 2019-10-16: 40 mg via INTRAVENOUS

## 2019-10-16 MED ORDER — HYDROMORPHONE HCL 1 MG/ML IJ SOLN
INTRAMUSCULAR | Status: AC
  Filled 2019-10-16: qty 1

## 2019-10-16 MED ORDER — DEXAMETHASONE SODIUM PHOSPHATE 10 MG/ML IJ SOLN: 10.0000 mg | Freq: Once | INTRAMUSCULAR | Status: DC

## 2019-10-16 MED ORDER — MORPHINE SULFATE (PF) 4 MG/ML IV SOLN
INTRAVENOUS | Status: AC
  Filled 2019-10-16: qty 1

## 2019-10-16 MED ORDER — LACTATED RINGERS IV SOLN: INTRAVENOUS | Status: DC

## 2019-10-16 MED ORDER — ONDANSETRON HCL 4 MG/2ML IJ SOLN: 4.0000 mg | Freq: Four times a day (QID) | INTRAMUSCULAR | Status: DC | PRN

## 2019-10-16 MED ORDER — CHLORHEXIDINE GLUCONATE 4 % EX LIQD: 60.0000 mL | Freq: Once | CUTANEOUS | Status: DC

## 2019-10-16 MED ORDER — SODIUM CHLORIDE 0.9 % IV SOLN
INTRAVENOUS | Status: DC | PRN
  Administered 2019-10-16: 20 mL via INTRAMUSCULAR

## 2019-10-16 MED ORDER — FENTANYL CITRATE (PF) 100 MCG/2ML IJ SOLN
INTRAMUSCULAR | Status: DC | PRN
  Administered 2019-10-16 (×4): 50 ug via INTRAVENOUS

## 2019-10-16 MED ORDER — HYDROCODONE-ACETAMINOPHEN 5-325 MG PO TABS: 1.0000 | ORAL_TABLET | ORAL | Status: DC | PRN

## 2019-10-16 MED ORDER — TRANEXAMIC ACID-NACL 1000-0.7 MG/100ML-% IV SOLN: 1000.0000 mg | Freq: Once | INTRAVENOUS | Status: DC

## 2019-10-16 MED ORDER — OXYCODONE HCL 5 MG/5ML PO SOLN: 5.0000 mg | Freq: Once | ORAL | Status: DC | PRN

## 2019-10-16 MED ORDER — ACETAMINOPHEN 500 MG PO TABS: 1000.0000 mg | ORAL_TABLET | Freq: Once | ORAL | Status: DC

## 2019-10-16 MED ORDER — MAGNESIUM CITRATE PO SOLN
1.0000 | Freq: Once | ORAL | Status: DC | PRN
  Filled 2019-10-16: qty 296

## 2019-10-16 MED ORDER — MIDAZOLAM HCL 5 MG/5ML IJ SOLN
INTRAMUSCULAR | Status: DC | PRN
  Administered 2019-10-16: 2 mg via INTRAVENOUS

## 2019-10-16 MED ORDER — TRANEXAMIC ACID-NACL 1000-0.7 MG/100ML-% IV SOLN
1000.0000 mg | INTRAVENOUS | Status: AC
  Administered 2019-10-16: 1000 mg via INTRAVENOUS

## 2019-10-16 MED ORDER — METHOCARBAMOL 500 MG PO TABS: 500.0000 mg | ORAL_TABLET | Freq: Four times a day (QID) | ORAL | Status: DC | PRN

## 2019-10-16 MED ORDER — METOCLOPRAMIDE HCL 5 MG/ML IJ SOLN
INTRAMUSCULAR | Status: AC
  Filled 2019-10-16: qty 2

## 2019-10-16 MED ORDER — ASPIRIN EC 81 MG PO TBEC: 81.0000 mg | DELAYED_RELEASE_TABLET | Freq: Two times a day (BID) | ORAL | 0 refills | Status: DC

## 2019-10-16 MED ORDER — PHENOL 1.4 % MT LIQD
1.0000 | OROMUCOSAL | Status: DC | PRN
  Filled 2019-10-16: qty 177

## 2019-10-16 MED ORDER — METOCLOPRAMIDE HCL 5 MG PO TABS
5.0000 mg | ORAL_TABLET | Freq: Three times a day (TID) | ORAL | Status: DC | PRN
  Administered 2019-10-16: 10 mg via ORAL
  Filled 2019-10-16: qty 2

## 2019-10-16 MED ORDER — PROPOFOL 10 MG/ML IV BOLUS
INTRAVENOUS | Status: AC
  Filled 2019-10-16: qty 20

## 2019-10-16 MED ORDER — ONDANSETRON HCL 4 MG/2ML IJ SOLN
INTRAMUSCULAR | Status: DC | PRN
  Administered 2019-10-16: 4 mg via INTRAVENOUS

## 2019-10-16 MED ORDER — PROPOFOL 500 MG/50ML IV EMUL
INTRAVENOUS | Status: DC | PRN
  Administered 2019-10-16: 150 ug/kg/min via INTRAVENOUS

## 2019-10-16 MED ORDER — METOCLOPRAMIDE HCL 5 MG/ML IJ SOLN: 5.0000 mg | Freq: Three times a day (TID) | INTRAMUSCULAR | Status: DC | PRN

## 2019-10-16 MED ORDER — WATER FOR IRRIGATION, STERILE IR SOLN
Status: DC | PRN
  Administered 2019-10-16: 2000 mL

## 2019-10-16 MED ORDER — ACETAMINOPHEN 500 MG PO TABS: 500.0000 mg | ORAL_TABLET | Freq: Four times a day (QID) | ORAL | Status: DC

## 2019-10-16 MED ORDER — ONDANSETRON HCL 4 MG PO TABS
4.0000 mg | ORAL_TABLET | Freq: Four times a day (QID) | ORAL | Status: DC | PRN
  Filled 2019-10-16: qty 1

## 2019-10-16 MED ORDER — GLYCOPYRROLATE PF 0.2 MG/ML IJ SOSY
PREFILLED_SYRINGE | INTRAMUSCULAR | Status: DC | PRN
  Administered 2019-10-16 (×2): .2 mg via INTRAVENOUS

## 2019-10-16 MED ORDER — HYDROMORPHONE HCL 1 MG/ML IJ SOLN
INTRAMUSCULAR | Status: DC | PRN
  Administered 2019-10-16 (×2): .5 mg via INTRAVENOUS

## 2019-10-16 MED ORDER — HYDROCODONE-ACETAMINOPHEN 5-325 MG PO TABS: 1.0000 | ORAL_TABLET | Freq: Four times a day (QID) | ORAL | 0 refills | Status: DC | PRN

## 2019-10-16 MED ORDER — MENTHOL 3 MG MT LOZG
1.0000 | LOZENGE | OROMUCOSAL | Status: DC | PRN
  Filled 2019-10-16: qty 9

## 2019-10-16 MED ORDER — CEFAZOLIN SODIUM-DEXTROSE 2-4 GM/100ML-% IV SOLN
INTRAVENOUS | Status: AC
  Filled 2019-10-16: qty 100

## 2019-10-16 MED ORDER — ALUM & MAG HYDROXIDE-SIMETH 200-200-20 MG/5ML PO SUSP
30.0000 mL | ORAL | Status: DC | PRN
  Filled 2019-10-16: qty 30

## 2019-10-16 MED ORDER — ONDANSETRON HCL 4 MG/2ML IJ SOLN
INTRAMUSCULAR | Status: AC
  Filled 2019-10-16: qty 2

## 2019-10-16 MED ORDER — EPHEDRINE SULFATE-NACL 50-0.9 MG/10ML-% IV SOSY
PREFILLED_SYRINGE | INTRAVENOUS | Status: DC | PRN
  Administered 2019-10-16: 5 mg via INTRAVENOUS

## 2019-10-16 MED ORDER — ASPIRIN 81 MG PO CHEW
81.0000 mg | CHEWABLE_TABLET | Freq: Two times a day (BID) | ORAL | Status: DC
  Filled 2019-10-16: qty 1

## 2019-10-16 MED ORDER — PROMETHAZINE HCL 25 MG/ML IJ SOLN: 6.2500 mg | INTRAMUSCULAR | Status: DC | PRN

## 2019-10-16 MED ORDER — HYDROMORPHONE HCL 2 MG/ML IJ SOLN
INTRAMUSCULAR | Status: AC
  Filled 2019-10-16: qty 1

## 2019-10-16 MED ORDER — CELECOXIB 200 MG PO CAPS
200.0000 mg | ORAL_CAPSULE | Freq: Two times a day (BID) | ORAL | Status: DC
  Administered 2019-10-16: 200 mg via ORAL

## 2019-10-16 MED ORDER — ACETAMINOPHEN 500 MG PO TABS
ORAL_TABLET | ORAL | Status: AC
  Administered 2019-10-16: 1000 mg via ORAL
  Filled 2019-10-16: qty 2

## 2019-10-16 MED ORDER — 0.9 % SODIUM CHLORIDE (POUR BTL) OPTIME
TOPICAL | Status: DC | PRN
  Administered 2019-10-16: 1000 mL

## 2019-10-16 MED ORDER — POVIDONE-IODINE 10 % EX SWAB: 2.0000 "application " | Freq: Once | CUTANEOUS | Status: DC

## 2019-10-16 MED ORDER — METHOCARBAMOL 500 MG IVPB - SIMPLE MED
INTRAVENOUS | Status: AC
  Filled 2019-10-16: qty 50

## 2019-10-16 MED ORDER — PROPOFOL 500 MG/50ML IV EMUL
INTRAVENOUS | Status: AC
  Filled 2019-10-16: qty 50

## 2019-10-16 MED ORDER — ACETAMINOPHEN 325 MG PO TABS: 325.0000 mg | ORAL_TABLET | Freq: Four times a day (QID) | ORAL | Status: DC | PRN

## 2019-10-16 MED ORDER — DEXAMETHASONE SODIUM PHOSPHATE 10 MG/ML IJ SOLN
INTRAMUSCULAR | Status: DC | PRN
  Administered 2019-10-16: 10 mg via INTRAVENOUS

## 2019-10-16 MED ORDER — POLYETHYLENE GLYCOL 3350 17 G PO PACK
17.0000 g | PACK | Freq: Every day | ORAL | Status: DC | PRN
  Filled 2019-10-16: qty 1

## 2019-10-16 MED ORDER — EPHEDRINE 5 MG/ML INJ
INTRAVENOUS | Status: AC
  Filled 2019-10-16: qty 10

## 2019-10-16 MED ORDER — CEFAZOLIN SODIUM-DEXTROSE 2-4 GM/100ML-% IV SOLN
2.0000 g | INTRAVENOUS | Status: AC
  Administered 2019-10-16: 2 g via INTRAVENOUS

## 2019-10-16 MED ORDER — CEFAZOLIN SODIUM-DEXTROSE 1-4 GM/50ML-% IV SOLN
1.0000 g | Freq: Four times a day (QID) | INTRAVENOUS | Status: DC
  Administered 2019-10-16: 1 g via INTRAVENOUS

## 2019-10-16 MED ORDER — SORBITOL 70 % SOLN
30.0000 mL | Freq: Every day | Status: DC | PRN
  Filled 2019-10-16: qty 30

## 2019-10-16 MED ORDER — BACLOFEN 10 MG PO TABS: 10.0000 mg | ORAL_TABLET | Freq: Three times a day (TID) | ORAL | 0 refills | Status: DC | PRN

## 2019-10-16 MED ORDER — ONDANSETRON HCL 4 MG PO TABS: 4.0000 mg | ORAL_TABLET | Freq: Three times a day (TID) | ORAL | 0 refills | Status: DC | PRN

## 2019-10-16 MED ORDER — ROCURONIUM BROMIDE 50 MG/5ML IV SOSY
PREFILLED_SYRINGE | INTRAVENOUS | Status: DC | PRN
  Administered 2019-10-16: 10 mg via INTRAVENOUS
  Administered 2019-10-16: 60 mg via INTRAVENOUS
  Administered 2019-10-16: 10 mg via INTRAVENOUS

## 2019-10-16 MED ORDER — SUGAMMADEX SODIUM 200 MG/2ML IV SOLN
INTRAVENOUS | Status: DC | PRN
  Administered 2019-10-16: 200 mg via INTRAVENOUS

## 2019-10-16 MED ORDER — CELECOXIB 100 MG PO CAPS: 100.0000 mg | ORAL_CAPSULE | Freq: Two times a day (BID) | ORAL | 0 refills | Status: AC

## 2019-10-16 MED ORDER — SODIUM CHLORIDE (PF) 0.9 % IJ SOLN
INTRAMUSCULAR | Status: AC
  Filled 2019-10-16: qty 10

## 2019-10-16 MED ORDER — TRANEXAMIC ACID-NACL 1000-0.7 MG/100ML-% IV SOLN
INTRAVENOUS | Status: AC
  Filled 2019-10-16: qty 100

## 2019-10-16 MED ORDER — ACETAMINOPHEN 500 MG PO TABS: 1000.0000 mg | ORAL_TABLET | Freq: Once | ORAL | Status: AC

## 2019-10-16 MED ORDER — OXYCODONE HCL 5 MG PO TABS: 5.0000 mg | ORAL_TABLET | Freq: Once | ORAL | Status: DC | PRN

## 2019-10-16 SURGICAL SUPPLY — 46 items
APL PRP STRL LF DISP 70% ISPRP (MISCELLANEOUS) ×1
BAG SPEC THK2 15X12 ZIP CLS (MISCELLANEOUS)
BAG ZIPLOCK 12X15 (MISCELLANEOUS) IMPLANT
BLADE SAG 18X100X1.27 (BLADE) ×3 IMPLANT
BLADE SURG SZ10 CARB STEEL (BLADE) ×6 IMPLANT
CHLORAPREP W/TINT 26 (MISCELLANEOUS) ×3 IMPLANT
CLOSURE STERI-STRIP 1/2X4 (GAUZE/BANDAGES/DRESSINGS) ×1
CLSR STERI-STRIP ANTIMIC 1/2X4 (GAUZE/BANDAGES/DRESSINGS) ×2 IMPLANT
COVER PERINEAL POST (MISCELLANEOUS) ×3 IMPLANT
COVER SURGICAL LIGHT HANDLE (MISCELLANEOUS) ×3 IMPLANT
COVER WAND RF STERILE (DRAPES) IMPLANT
DECANTER SPIKE VIAL GLASS SM (MISCELLANEOUS) ×6 IMPLANT
DRAPE IMP U-DRAPE 54X76 (DRAPES) ×3 IMPLANT
DRAPE STERI IOBAN 125X83 (DRAPES) ×3 IMPLANT
DRAPE U-SHAPE 47X51 STRL (DRAPES) ×6 IMPLANT
DRSG MEPILEX BORDER 4X8 (GAUZE/BANDAGES/DRESSINGS) ×3 IMPLANT
ELECT BLADE TIP CTD 4 INCH (ELECTRODE) ×3 IMPLANT
ELECT REM PT RETURN 15FT ADLT (MISCELLANEOUS) ×3 IMPLANT
GLOVE BIO SURGEON STRL SZ7.5 (GLOVE) ×6 IMPLANT
GLOVE BIOGEL PI IND STRL 8 (GLOVE) ×2 IMPLANT
GLOVE BIOGEL PI INDICATOR 8 (GLOVE) ×4
GOWN STRL REUS W/TWL LRG LVL3 (GOWN DISPOSABLE) ×3 IMPLANT
GOWN STRL REUS W/TWL XL LVL3 (GOWN DISPOSABLE) ×3 IMPLANT
HEAD BIOLOX HIP 36/-5 (Joint) IMPLANT
HIP BIOLOX HD 36/-5 (Joint) ×3 IMPLANT
HOLDER FOLEY CATH W/STRAP (MISCELLANEOUS) IMPLANT
INSERT TRIDENT POLY 36MM 0DEG (Insert) ×2 IMPLANT
KIT TURNOVER KIT A (KITS) IMPLANT
MANIFOLD NEPTUNE II (INSTRUMENTS) ×3 IMPLANT
NS IRRIG 1000ML POUR BTL (IV SOLUTION) ×3 IMPLANT
PACK ANTERIOR HIP CUSTOM (KITS) ×3 IMPLANT
PENCIL SMOKE EVACUATOR (MISCELLANEOUS) ×2 IMPLANT
PROTECTOR NERVE ULNAR (MISCELLANEOUS) ×3 IMPLANT
SCREW HEX LP 6.5X20 (Screw) ×2 IMPLANT
SHELL CLUSTERHOLE ACETABULAR 5 (Shell) ×2 IMPLANT
STEM ACCOLADE SZ 6 (Hips) ×2 IMPLANT
SUT MNCRL AB 4-0 PS2 18 (SUTURE) ×3 IMPLANT
SUT STRATAFIX 0 PDS 27 VIOLET (SUTURE) ×3
SUT VIC AB 0 CT1 36 (SUTURE) ×3 IMPLANT
SUT VIC AB 1 CT1 36 (SUTURE) ×3 IMPLANT
SUT VIC AB 2-0 CT1 27 (SUTURE) ×6
SUT VIC AB 2-0 CT1 TAPERPNT 27 (SUTURE) ×2 IMPLANT
SUTURE STRATFX 0 PDS 27 VIOLET (SUTURE) ×1 IMPLANT
TRAY FOLEY MTR SLVR 16FR STAT (SET/KITS/TRAYS/PACK) IMPLANT
WATER STERILE IRR 1000ML POUR (IV SOLUTION) ×6 IMPLANT
YANKAUER SUCT BULB TIP 10FT TU (MISCELLANEOUS) ×3 IMPLANT

## 2019-10-16 NOTE — Progress Notes (Signed)
Physical Therapy Treatment Patient Details Name: Alex Clark MRN: [id number] DOB: 14-Nov-1950 Today's Date: 10/16/2019    History of Present Illness Patient is 69 y.o. male s/p Lt THA with PMH significant for DM, GERD, and knee arthroscopy.    PT Comments    Additional session provided for gait training with patient as he was limited by nausea, dizziness in prior session. Pt able to progress gait distance to ~90 feet with no seated rest break and was able to stand for extended period for bathroom use with no symptoms. Patient is safe for discharge home at this time. Will continue to progress to Mod Independent goals if remains in acute setting.    Follow Up Recommendations  Follow surgeon's recommendation for DC plan and follow-up therapies     Equipment Recommendations  Rolling walker with 5" wheels(delivered in PACU)    Recommendations for Other Services       Precautions / Restrictions Precautions Precautions: Fall Restrictions Weight Bearing Restrictions: No    Mobility  Bed Mobility Overal bed mobility: Needs Assistance Bed Mobility: Supine to Sit     Supine to sit: Modified independent (Device/Increase time);HOB elevated     General bed mobility comments: pt in recliner at start of session  Transfers Overall transfer level: Needs assistance Equipment used: Rolling walker (2 wheeled) Transfers: Sit to/from Stand Sit to Stand: Supervision         General transfer comment: pt with good carryover for safe hand placement and technique, no assist required  Ambulation/Gait Ambulation/Gait assistance: Supervision Gait Distance (Feet): 90 Feet Assistive device: Rolling walker (2 wheeled) Gait Pattern/deviations: Step-to pattern;Decreased step length - right;Decreased stance time - left;Decreased stride length;Decreased weight shift to left Gait velocity: decreased   General Gait Details: pt remain slow and cautious with gait, able to increase speed with cues  for step through pattern. no overt LOB. no nausea, lightheaded sensation, or dizziness with mobility this session.   Stairs Stairs: Yes Stairs assistance: Supervision Stair Management: Two rails;Forwards;Step to pattern Number of Stairs: 3 General stair comments: verbal cues for safe step pattern "up with good, down with bad", pt with good safety awareness and verbalized safe positioning of RW and person(s) guarding during mobility   Wheelchair Mobility    Modified Rankin (Stroke Patients Only)       Balance Overall balance assessment: Mild deficits observed, not formally tested   Sitting balance-Leahy Scale: Good       Standing balance-Leahy Scale: Fair             Cognition Arousal/Alertness: Awake/alert Behavior During Therapy: WFL for tasks assessed/performed Overall Cognitive Status: Within Functional Limits for tasks assessed             Exercises Total Joint Exercises Ankle Circles/Pumps: AROM;Seated;Both;10 reps Quad Sets: AROM;5 reps;Seated;Left Short Arc Quad: AROM;5 reps;Seated;Left Heel Slides: AAROM;5 reps;Seated;Left Hip ABduction/ADduction: AROM;5 reps;Seated;Left Long Arc Quad: AROM;5 reps;Seated;Left Knee Flexion: AROM;5 reps;Standing;Left Other Exercises Other Exercises: Standing Exercises: reviewed marching, hip abduction, hip extension, 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)    General Comments        Pertinent Vitals/Pain Pain Assessment: 0-10 Pain Score: 4  Pain Location: Lt hip Pain Descriptors / Indicators: Aching;Discomfort Pain Intervention(s): Monitored during session    Home Living Family/patient expects to be discharged to:: Private residence Living Arrangements: Alone Available Help at Discharge: Friend(s) Type of Home: House Home Access: Stairs to enter Entrance Stairs-Rails: Can reach both Home Layout: One level Home Equipment:  None      Prior Function Level of Independence:  Independent          PT Goals (current goals can now be found in the care plan section) Acute Rehab PT Goals Patient Stated Goal: to not be nauseous and get home PT Goal Formulation: With patient Time For Goal Achievement: 10/23/19 Potential to Achieve Goals: Good Progress towards PT goals: Progressing toward goals    Frequency    7X/week      PT Plan Current plan remains appropriate       AM-PAC PT "6 Clicks" Mobility   Outcome Measure  Help needed turning from your back to your side while in a flat bed without using bedrails?: A Little Help needed moving from lying on your back to sitting on the side of a flat bed without using bedrails?: A Little Help needed moving to and from a bed to a chair (including a wheelchair)?: A Little Help needed standing up from a chair using your arms (e.g., wheelchair or bedside chair)?: A Little Help needed to walk in hospital room?: A Little Help needed climbing 3-5 steps with a railing? : A Little 6 Click Score: 18    End of Session Equipment Utilized During Treatment: Gait belt Activity Tolerance: Patient tolerated treatment well Patient left: in chair;with call bell/phone within reach Nurse Communication: Mobility status;Other (comment)(nausea meds provided during session) PT Visit Diagnosis: Muscle weakness (generalized) (M62.81);Difficulty in walking, not elsewhere classified (R26.2)     Time: [id number] PT Time Calculation (min) (ACUTE ONLY): 13 min  Charges:  $Gait Training: 8-22 mins                    Verner Mould, DPT Physical Therapist with [facility] [phone]  10/16/2019 5:22 PM

## 2019-10-16 NOTE — Evaluation (Signed)
Physical Therapy Evaluation Patient Details Name: Alex Clark MRN: [id number] DOB: January 22, 1951 Today's Date: 10/16/2019   History of Present Illness  Patient is 69 y.o. male s/p Lt THA with PMH significant for DM, GERD, and knee arthroscopy.  Clinical Impression  Alex Clark is a 69 y.o. male POD 0 s/p Lt THA anterior approach. Patient reports independence with mobility at baseline. Patient is now limited by functional impairments (see PT problem list below) and requires supervision for transfers and gait with RW. Patient was able to ambulate ~70 feet with RW and supervision and cues for safe walker management. He required seated rest halfway through gait due to nausea. Patient educated on safe sequencing for stair mobility and verbalized safe guarding position for people assisting with mobility. Patient instructed in exercises to facilitate ROM and circulation. Patient will benefit from continued skilled PT interventions to address impairments and progress towards PLOF. Patient has met mobility goals at adequate level for discharge home; will continue to follow if pt continues acute stay to progress towards Mod I goals.     Follow Up Recommendations Follow surgeon's recommendation for DC plan and follow-up therapies    Equipment Recommendations  Rolling walker with 5" wheels(delivered in PACU)    Recommendations for Other Services       Precautions / Restrictions Precautions Precautions: Fall Restrictions Weight Bearing Restrictions: No      Mobility  Bed Mobility Overal bed mobility: Needs Assistance Bed Mobility: Supine to Sit     Supine to sit: Modified independent (Device/Increase time);HOB elevated     General bed mobility comments: pt with extra time, no cues or assist required  Transfers Overall transfer level: Needs assistance Equipment used: Rolling walker (2 wheeled) Transfers: Sit to/from Stand Sit to Stand: Supervision         General transfer comment:  cues for safe hand placement and technique with RW, no assist required for power up  Ambulation/Gait Ambulation/Gait assistance: Supervision Gait Distance (Feet): 70 Feet(seated rest break in the middle due to nausea) Assistive device: Rolling walker (2 wheeled) Gait Pattern/deviations: Step-to pattern;Decreased step length - right;Decreased stance time - left;Decreased stride length;Decreased weight shift to left Gait velocity: decreased   General Gait Details: pt with slow gait and step to pattern, no overt LOB, pt limited by nausea during mobility and required seated rest break  Stairs Stairs: Yes Stairs assistance: Supervision Stair Management: Two rails;Forwards;Step to pattern Number of Stairs: 3 General stair comments: verbal cues for safe step pattern "up with good, down with bad", pt with good safety awareness and verbalized safe positioning of RW and person(s) guarding during mobility  Wheelchair Mobility    Modified Rankin (Stroke Patients Only)       Balance Overall balance assessment: Mild deficits observed, not formally tested   Sitting balance-Leahy Scale: Good       Standing balance-Leahy Scale: Fair                Pertinent Vitals/Pain Pain Assessment: 0-10 Pain Score: 5  Pain Location: Lt hip Pain Descriptors / Indicators: Aching;Discomfort Pain Intervention(s): Monitored during session    Home Living Family/patient expects to be discharged to:: Private residence Living Arrangements: Alone Available Help at Discharge: Friend(s) Type of Home: House Home Access: Stairs to enter Entrance Stairs-Rails: Can reach both Entrance Stairs-Number of Steps: 1 Home Layout: One level Home Equipment: None      Prior Function Level of Independence: Independent  Hand Dominance   Dominant Hand: Right    Extremity/Trunk Assessment   Upper Extremity Assessment Upper Extremity Assessment: Overall WFL for tasks assessed    Lower  Extremity Assessment Lower Extremity Assessment: Overall WFL for tasks assessed;LLE deficits/detail LLE Sensation: WNL LLE Coordination: WNL    Cervical / Trunk Assessment Cervical / Trunk Assessment: Normal  Communication   Communication: No difficulties  Cognition Arousal/Alertness: Awake/alert Behavior During Therapy: WFL for tasks assessed/performed Overall Cognitive Status: Within Functional Limits for tasks assessed         General Comments      Exercises Total Joint Exercises Ankle Circles/Pumps: AROM;Seated;Both;10 reps Quad Sets: AROM;5 reps;Seated;Left Short Arc Quad: AROM;5 reps;Seated;Left Heel Slides: AAROM;5 reps;Seated;Left Hip ABduction/ADduction: AROM;5 reps;Seated;Left Long Arc Quad: AROM;5 reps;Seated;Left Knee Flexion: AROM;5 reps;Standing;Left Other Exercises Other Exercises: Standing Exercises: reviewed marching, hip abduction, hip extension, knee flexion (educated to perform at [employer] for support)   Assessment/Plan    PT Assessment Patient needs continued PT services  PT Problem List Decreased strength;Decreased balance;Decreased mobility;Decreased range of motion;Decreased activity tolerance;Decreased knowledge of use of DME       PT Treatment Interventions DME instruction;Functional mobility training;Therapeutic activities;Gait training;Stair training;Therapeutic exercise;Patient/family education;Balance training    PT Goals (Current goals can be found in the Care Plan section)  Acute Rehab PT Goals Patient Stated Goal: to not be nauseous and get home PT Goal Formulation: With patient Time For Goal Achievement: 10/23/19 Potential to Achieve Goals: Good    Frequency 7X/week    AM-PAC PT "6 Clicks" Mobility  Outcome Measure Help needed turning from your back to your side while in a flat bed without using bedrails?: A Little Help needed moving from lying on your back to sitting on the side of a flat bed without using bedrails?: A  Little Help needed moving to and from a bed to a chair (including a wheelchair)?: A Little Help needed standing up from a chair using your arms (e.g., wheelchair or bedside chair)?: A Little Help needed to walk in hospital room?: A Little Help needed climbing 3-5 steps with a railing? : A Little 6 Click Score: 18    End of Session Equipment Utilized During Treatment: Gait belt Activity Tolerance: Patient tolerated treatment well Patient left: in chair;with call bell/phone within reach Nurse Communication: Mobility status;Other (comment)(nausea meds provided during session) PT Visit Diagnosis: Muscle weakness (generalized) (M62.81);Difficulty in walking, not elsewhere classified (R26.2)    Time: 3428-7681 PT Time Calculation (min) (ACUTE ONLY): 40 min   Charges:   PT Evaluation $PT Eval Low Complexity: 1 Low PT Treatments $Gait Training: 8-22 mins $Therapeutic Exercise: 8-22 mins       Verner Mould, DPT Physical Therapist with [facility] [phone]  10/16/2019 3:26 PM

## 2019-10-16 NOTE — Transfer of Care (Signed)
Immediate Anesthesia Transfer of Care Note  Patient: Alex Clark  Procedure(s) Performed: TOTAL HIP ARTHROPLASTY ANTERIOR APPROACH (Left Hip)  Patient Location: PACU  Anesthesia Type:General  Level of Consciousness: sedated  Airway & Oxygen Therapy: Patient Spontanous Breathing and Patient connected to face mask oxygen  Post-op Assessment: Report given to RN and Post -op Vital signs reviewed and stable  Post vital signs: Reviewed and stable  Last Vitals:  Vitals Value Taken Time  BP    Temp    Pulse 74 10/16/19 0934  Resp 9 10/16/19 0934  SpO2 100 % 10/16/19 0934  Vitals shown include unvalidated device data.  Last Pain:  Vitals:   10/16/19 0557  TempSrc:   PainSc: 4       Patients Stated Pain Goal: 4 ([id number])  Complications: No apparent anesthesia complications

## 2019-10-16 NOTE — Anesthesia Postprocedure Evaluation (Signed)
Anesthesia Post Note  Patient: Alex Clark  Procedure(s) Performed: TOTAL HIP ARTHROPLASTY ANTERIOR APPROACH (Left Hip)     Patient location during evaluation: PACU Anesthesia Type: General Level of consciousness: awake and alert and oriented Pain management: pain level controlled Vital Signs Assessment: post-procedure vital signs reviewed and stable Respiratory status: spontaneous breathing, nonlabored ventilation and respiratory function stable Cardiovascular status: blood pressure returned to baseline Postop Assessment: no apparent nausea or vomiting Anesthetic complications: no    Last Vitals:  Vitals:   10/16/19 1015 10/16/19 1030  BP: (!) 131/99 118/81  Pulse: 77 (!) 50  Resp: 14 15  Temp:  36.7 C  SpO2:  100%    Last Pain:  Vitals:   10/16/19 1045  TempSrc:   PainSc: [place]

## 2019-10-16 NOTE — Anesthesia Procedure Notes (Signed)
Procedure Name: Intubation Date/Time: 10/16/2019 7:32 AM Performed by: Montel Clock, CRNA Pre-anesthesia Checklist: Patient identified, Emergency Drugs available, Suction available, Patient being monitored and Timeout performed Patient Re-evaluated:Patient Re-evaluated prior to induction Oxygen Delivery Method: Circle system utilized Preoxygenation: Pre-oxygenation with 100% oxygen Induction Type: IV induction Ventilation: Mask ventilation without difficulty Laryngoscope Size: Mac and 3 Grade View: Grade II Tube type: Oral Tube size: 7.5 mm Number of attempts: 1 Airway Equipment and Method: Stylet Placement Confirmation: ETT inserted through vocal cords under direct vision,  positive ETCO2 and breath sounds checked- equal and bilateral Secured at: 23 cm Tube secured with: Tape Dental Injury: Teeth and Oropharynx as per pre-operative assessment

## 2019-10-16 NOTE — Interval H&P Note (Signed)
I participated in the care of this patient and agree with the above history, physical and evaluation. I performed a review of the history and a physical exam as detailed   Winnell Bento Daniel Declan Adamson MD  

## 2019-10-16 NOTE — Discharge Instructions (Signed)

## 2019-10-16 NOTE — Op Note (Signed)
10/16/2019  9:43 AM  PATIENT:  Alex Clark   MRN: [id number]  PRE-OPERATIVE DIAGNOSIS:  OA LEFT HIP  POST-OPERATIVE DIAGNOSIS:  OA LEFT HIP  PROCEDURE:  Procedure(s): TOTAL HIP ARTHROPLASTY ANTERIOR APPROACH  PREOPERATIVE INDICATIONS:    SAMEUL Clark is an 69 y.o. male who has a diagnosis of Primary osteoarthritis of left hip and elected for surgical management after failing conservative treatment.  The risks benefits and alternatives were discussed with the patient including but not limited to the risks of nonoperative treatment, versus surgical intervention including infection, bleeding, nerve injury, periprosthetic fracture, the need for revision surgery, dislocation, leg length discrepancy, blood clots, cardiopulmonary complications, morbidity, mortality, among others, and they were willing to proceed.     OPERATIVE REPORT     SURGEON:   Renette Butters, MD    ASSISTANT:  Roxan Hockey, PA-C, he was present and scrubbed throughout the case, critical for completion in a timely fashion, and for retraction, instrumentation, and closure.     ANESTHESIA:  General    COMPLICATIONS:  None.     COMPONENTS:  Stryker acolade fit femur size 6 with a 36 mm -5 head ball and an acetabular shell size 52 with a  polyethylene liner    PROCEDURE IN DETAIL:   The patient was met in the holding area and  identified.  The appropriate hip was identified and marked at the operative site.  The patient was then transported to the OR  and  placed under anesthesia per that record.  At that point, the patient was  placed in the supine position and  secured to the operating room table and all bony prominences padded. He received pre-operative antibiotics    The operative lower extremity was prepped from the iliac crest to the distal leg.  Sterile draping was performed.  Time out was performed prior to incision.      Skin incision was made just 2 cm lateral to the ASIS  extending in line with the  tensor fascia lata. Electrocautery was used to control all bleeders. I dissected down sharply to the fascia of the tensor fascia lata was confirmed that the muscle fibers beneath were running posteriorly. I then incised the fascia over the superficial tensor fascia lata in line with the incision. The fascia was elevated off the anterior aspect of the muscle the muscle was retracted posteriorly and protected throughout the case. I then used electrocautery to incise the tensor fascia lata fascia control and all bleeders. Immediately visible was the fat over top of the anterior neck and capsule.  I removed the anterior fat from the capsule and elevated the rectus muscle off of the anterior capsule. I then removed a large time of capsule. The retractors were then placed over the anterior acetabulum as well as around the superior and inferior neck.  I then made a femoral neck cut. Then used the power corkscrew to remove the femoral head from the acetabulum and thoroughly irrigated the acetabulum. I sized the femoral head.    I then exposed the deep acetabulum, cleared out any tissue including the ligamentum teres.   After adequate visualization, I excised the labrum, and then sequentially reamed.  I then impacted the acetabular implant into place using fluoroscopy for guidance.  Appropriate version and inclination was confirmed clinically matching their bony anatomy, and with fluoroscopy.  I placed a 20 mm screw in the posterior/superio position with an excellent bite.    I then placed the polyethylene liner  in place  I then adducted the leg and released the external rotators from the posterior femur allowing it to be easily delivered up lateral and anterior to the acetabulum for preparation of the femoral canal.    I then prepared the proximal femur using the cookie-cutter and then sequentially reamed and broached.  A trial broach, neck, and head was utilized, and I reduced the hip and used floroscopy to  assess the neck length and femoral implant.  I then impacted the femoral prosthesis into place into the appropriate version. The hip was then reduced and fluoroscopy confirmed appropriate position. Leg lengths were restored.  I then irrigated the hip copiously again with, and repaired the fascia with Vicryl, followed by monocryl for the subcutaneous tissue, Monocryl for the skin, Steri-Strips and sterile gauze. The patient was then awakened and returned to PACU in stable and satisfactory condition. There were no complications.  POST OPERATIVE PLAN: WBAT, DVT px: SCD's/TED, ambulation and chemical dvt px  Edmonia Lynch, MD Orthopedic Surgeon [phone]

## 2019-10-17 ENCOUNTER — Encounter: Payer: Self-pay | Admitting: *Deleted

## 2019-10-17 NOTE — Discharge Summary (Signed)
Discharge Summary  Patient ID: Alex Clark MRN: [id number] DOB/AGE: [birth year] 69 y.o.  Admit date: 10/16/2019 Discharge date: 10/17/2019  Admission Diagnoses:  Primary osteoarthritis of left hip  Discharge Diagnoses:  Principal Problem:   Primary osteoarthritis of left hip Active Problems:   GERD (gastroesophageal reflux disease)   Diabetes mellitus type 2, diet-controlled (HCC)   Primary localized osteoarthritis of hip   Past Medical History:  Diagnosis Date  . Diabetes mellitus without complication (HCC)    diet controlled  . Diverticulosis   . Eosinophilic esophagitis   . GERD (gastroesophageal reflux disease)     Surgeries: Procedure(s): TOTAL HIP ARTHROPLASTY ANTERIOR APPROACH on 10/16/2019   Consultants (if any):   Discharged Condition: Improved  Hospital Course: JOVAUN LEVENE is an 69 y.o. male who was admitted 10/16/2019 with a diagnosis of Primary osteoarthritis of left hip and went to the operating room on 10/16/2019 and underwent the above named procedures.    He was given perioperative antibiotics:  Anti-infectives (From admission, onward)   Start     Dose/Rate Route Frequency Ordered Stop   10/16/19 1400  ceFAZolin (ANCEF) IVPB 1 g/50 mL premix  Status:  Discontinued     1 g 100 mL/hr over 30 Minutes Intravenous Every 6 hours 10/16/19 0953 10/16/19 2224   10/16/19 1400  ceFAZolin (ANCEF) 2-4 GM/100ML-% IVPB  Status:  Discontinued    Note to Pharmacy: Everitt Amber   : cabinet override      10/16/19 1400 10/16/19 2224   10/16/19 0600  ceFAZolin (ANCEF) IVPB 2g/100 mL premix     2 g 200 mL/hr over 30 Minutes Intravenous On call to O.R. 10/16/19 0548 10/16/19 0743   10/16/19 0551  ceFAZolin (ANCEF) 2-4 GM/100ML-% IVPB    Note to Pharmacy: Randa Evens  : cabinet override      10/16/19 0551 10/16/19 0753    .  He was given sequential compression devices, early ambulation, and Aspirin for DVT prophylaxis.  He desired and met criteria for  same day discharge.  He successfully mobilized with therapy and benefited maximally from the hospital stay.  There were no complications.    Recent vital signs:  Vitals:   10/16/19 1015 10/16/19 1030  BP: (!) 131/99 118/81  Pulse: 77 (!) 50  Resp: 14 15  Temp:  98 F (36.7 C)  SpO2:  100%    Recent laboratory studies:  Lab Results  Component Value Date   HGB 14.9 10/12/2019   HGB 16.5 04/05/2016   HGB 14.8 07/12/2011   Lab Results  Component Value Date   WBC 8.5 10/12/2019   PLT 216 10/12/2019   No results found for: INR Lab Results  Component Value Date   NA 139 10/12/2019   K 4.1 10/12/2019   CL 105 10/12/2019   CO2 25 10/12/2019   BUN 15 10/12/2019   CREATININE 0.90 10/12/2019   GLUCOSE 79 10/12/2019    Discharge Medications:   Allergies as of 10/16/2019      Reactions   Pantoprazole Sodium Other (See Comments)   Headache      Medication List    STOP taking these medications   Advil 200 MG tablet Generic drug: ibuprofen     TAKE these medications   aspirin EC 81 MG tablet Take 1 tablet (81 mg total) by mouth 2 (two) times daily. For DVT prophylaxis for 30 days after surgery.   baclofen 10 MG tablet Commonly known as: LIORESAL Take 1 tablet (10 mg total)  by mouth 3 (three) times daily as needed for muscle spasms.   celecoxib 100 MG capsule Commonly known as: CeleBREX Take 1 capsule (100 mg total) by mouth 2 (two) times daily for 14 days.   HYDROcodone-acetaminophen 5-325 MG tablet Commonly known as: Norco Take 1-2 tablets by mouth every 6 (six) hours as needed for severe pain.   omeprazole 40 MG capsule Commonly known as: PRILOSEC TAKE 1 CAPSULE BY MOUTH DAILY BEFORE BREAKFAST What changed: See the new instructions.   ondansetron 4 MG tablet Commonly known as: Zofran Take 1 tablet (4 mg total) by mouth every 8 (eight) hours as needed for nausea or vomiting.       Diagnostic Studies: DG C-Arm 1-60 Min-No Report  Result Date:  10/16/2019 Fluoroscopy was utilized by the requesting physician.  No radiographic interpretation.   DG HIP OPERATIVE UNILAT W OR W/O PELVIS LEFT  Result Date: 10/16/2019 CLINICAL DATA:  Intraoperative imaging for left hip replacement. EXAM: OPERATIVE LEFT HIP WITH PELVIS; DG C-ARM 1-60 MIN-NO REPORT COMPARISON:  Plain films of the left hip 08/21/2019. FINDINGS: 2 fluoroscopic spot views of the left hip demonstrate a new total arthroplasty in place. The device is located. No acute abnormality. Advanced right hip osteoarthritis noted. IMPRESSION: Intraoperative imaging for left hip replacement.  No acute finding. Electronically Signed   By: Inge Rise M.D.   On: 10/16/2019 10:10    Disposition: Discharge disposition: 01-Home or Self Care       Discharge Instructions    Discharge patient   Complete by: As directed    Discharge disposition: 01-Home or Self Care   Discharge patient date: 10/16/2019      Follow-up Information    Renette Butters, MD. Go on 10/31/2019.   Specialty: Orthopedic Surgery Why: Your appointment is scheduled for 4:15 Contact information: [address] [place] [address] [place] [address] [phone]        [facility] Follow up.   Specialty: [place] Why: You will be seen by HHPT prior to starting Outpatient physical therapy  Contact information: [address] [place] [address] [phone]        [facility], [place]. Go on 10/31/2019.   Why: You are scheduled to start Outpatient physical therapy at 3:00. Please arrive at 2:30 to complete your paperwork. You will see MD after therapy  Contact information: [facility] [place] [address] [phone]        Renette Butters, MD.   Specialty: Orthopedic Surgery Contact information: [address] [place] [address] [phone]            Signed: Prudencio Burly III PA-C 10/17/2019, 7:25 AM

## 2019-10-18 DIAGNOSIS — M5137 Other intervertebral disc degeneration, lumbosacral region: Secondary | ICD-10-CM | POA: Diagnosis not present

## 2019-10-18 DIAGNOSIS — Z471 Aftercare following joint replacement surgery: Secondary | ICD-10-CM | POA: Diagnosis not present

## 2019-10-18 DIAGNOSIS — N529 Male erectile dysfunction, unspecified: Secondary | ICD-10-CM | POA: Diagnosis not present

## 2019-10-18 DIAGNOSIS — Z96642 Presence of left artificial hip joint: Secondary | ICD-10-CM | POA: Diagnosis not present

## 2019-10-18 DIAGNOSIS — Z7982 Long term (current) use of aspirin: Secondary | ICD-10-CM | POA: Diagnosis not present

## 2019-10-18 DIAGNOSIS — K2100 Gastro-esophageal reflux disease with esophagitis, without bleeding: Secondary | ICD-10-CM | POA: Diagnosis not present

## 2019-10-18 DIAGNOSIS — M47816 Spondylosis without myelopathy or radiculopathy, lumbar region: Secondary | ICD-10-CM | POA: Diagnosis not present

## 2019-10-18 DIAGNOSIS — K5790 Diverticulosis of intestine, part unspecified, without perforation or abscess without bleeding: Secondary | ICD-10-CM | POA: Diagnosis not present

## 2019-10-18 DIAGNOSIS — E119 Type 2 diabetes mellitus without complications: Secondary | ICD-10-CM | POA: Diagnosis not present

## 2019-10-22 DIAGNOSIS — M5137 Other intervertebral disc degeneration, lumbosacral region: Secondary | ICD-10-CM | POA: Diagnosis not present

## 2019-10-22 DIAGNOSIS — N529 Male erectile dysfunction, unspecified: Secondary | ICD-10-CM | POA: Diagnosis not present

## 2019-10-22 DIAGNOSIS — Z7982 Long term (current) use of aspirin: Secondary | ICD-10-CM | POA: Diagnosis not present

## 2019-10-22 DIAGNOSIS — K2100 Gastro-esophageal reflux disease with esophagitis, without bleeding: Secondary | ICD-10-CM | POA: Diagnosis not present

## 2019-10-22 DIAGNOSIS — Z96642 Presence of left artificial hip joint: Secondary | ICD-10-CM | POA: Diagnosis not present

## 2019-10-22 DIAGNOSIS — M47816 Spondylosis without myelopathy or radiculopathy, lumbar region: Secondary | ICD-10-CM | POA: Diagnosis not present

## 2019-10-22 DIAGNOSIS — K5790 Diverticulosis of intestine, part unspecified, without perforation or abscess without bleeding: Secondary | ICD-10-CM | POA: Diagnosis not present

## 2019-10-22 DIAGNOSIS — Z471 Aftercare following joint replacement surgery: Secondary | ICD-10-CM | POA: Diagnosis not present

## 2019-10-22 DIAGNOSIS — E119 Type 2 diabetes mellitus without complications: Secondary | ICD-10-CM | POA: Diagnosis not present

## 2019-10-24 ENCOUNTER — Ambulatory Visit: Payer: Medicare HMO | Attending: Internal Medicine

## 2019-10-24 DIAGNOSIS — M47816 Spondylosis without myelopathy or radiculopathy, lumbar region: Secondary | ICD-10-CM | POA: Diagnosis not present

## 2019-10-24 DIAGNOSIS — K5790 Diverticulosis of intestine, part unspecified, without perforation or abscess without bleeding: Secondary | ICD-10-CM | POA: Diagnosis not present

## 2019-10-24 DIAGNOSIS — N529 Male erectile dysfunction, unspecified: Secondary | ICD-10-CM | POA: Diagnosis not present

## 2019-10-24 DIAGNOSIS — Z7982 Long term (current) use of aspirin: Secondary | ICD-10-CM | POA: Diagnosis not present

## 2019-10-24 DIAGNOSIS — Z23 Encounter for immunization: Secondary | ICD-10-CM

## 2019-10-24 DIAGNOSIS — E119 Type 2 diabetes mellitus without complications: Secondary | ICD-10-CM | POA: Diagnosis not present

## 2019-10-24 DIAGNOSIS — K2100 Gastro-esophageal reflux disease with esophagitis, without bleeding: Secondary | ICD-10-CM | POA: Diagnosis not present

## 2019-10-24 DIAGNOSIS — Z96642 Presence of left artificial hip joint: Secondary | ICD-10-CM | POA: Diagnosis not present

## 2019-10-24 DIAGNOSIS — Z471 Aftercare following joint replacement surgery: Secondary | ICD-10-CM | POA: Diagnosis not present

## 2019-10-24 DIAGNOSIS — M5137 Other intervertebral disc degeneration, lumbosacral region: Secondary | ICD-10-CM | POA: Diagnosis not present

## 2019-10-24 NOTE — Progress Notes (Signed)
   Covid-19 Vaccination Clinic  Name:  [name]    MRN: [id number] DOB: April 22, 1951  10/24/2019  Mr. Kan was observed post Covid-19 immunization for 15 minutes without incidence. He was provided with Vaccine Information Sheet and instruction to access the V-Safe system.   Mr. Stueck was instructed to call 911 with any severe reactions post vaccine: Marland Kitchen Difficulty breathing  . Swelling of your face and throat  . A fast heartbeat  . A bad rash all over your body  . Dizziness and weakness    Immunizations Administered    Name Date Dose VIS Date Route   Pfizer COVID-19 Vaccine 10/24/2019  5:37 PM 0.3 mL 09/14/2019 Intramuscular   Manufacturer: [place]   Lot: [id number]   [place]atalia: [id number]

## 2019-10-26 DIAGNOSIS — E119 Type 2 diabetes mellitus without complications: Secondary | ICD-10-CM | POA: Diagnosis not present

## 2019-10-26 DIAGNOSIS — M47816 Spondylosis without myelopathy or radiculopathy, lumbar region: Secondary | ICD-10-CM | POA: Diagnosis not present

## 2019-10-26 DIAGNOSIS — Z7982 Long term (current) use of aspirin: Secondary | ICD-10-CM | POA: Diagnosis not present

## 2019-10-26 DIAGNOSIS — K5790 Diverticulosis of intestine, part unspecified, without perforation or abscess without bleeding: Secondary | ICD-10-CM | POA: Diagnosis not present

## 2019-10-26 DIAGNOSIS — M5137 Other intervertebral disc degeneration, lumbosacral region: Secondary | ICD-10-CM | POA: Diagnosis not present

## 2019-10-26 DIAGNOSIS — Z96642 Presence of left artificial hip joint: Secondary | ICD-10-CM | POA: Diagnosis not present

## 2019-10-26 DIAGNOSIS — Z471 Aftercare following joint replacement surgery: Secondary | ICD-10-CM | POA: Diagnosis not present

## 2019-10-26 DIAGNOSIS — N529 Male erectile dysfunction, unspecified: Secondary | ICD-10-CM | POA: Diagnosis not present

## 2019-10-26 DIAGNOSIS — K2100 Gastro-esophageal reflux disease with esophagitis, without bleeding: Secondary | ICD-10-CM | POA: Diagnosis not present

## 2019-10-29 DIAGNOSIS — N529 Male erectile dysfunction, unspecified: Secondary | ICD-10-CM | POA: Diagnosis not present

## 2019-10-29 DIAGNOSIS — Z96642 Presence of left artificial hip joint: Secondary | ICD-10-CM | POA: Diagnosis not present

## 2019-10-29 DIAGNOSIS — Z7982 Long term (current) use of aspirin: Secondary | ICD-10-CM | POA: Diagnosis not present

## 2019-10-29 DIAGNOSIS — K2100 Gastro-esophageal reflux disease with esophagitis, without bleeding: Secondary | ICD-10-CM | POA: Diagnosis not present

## 2019-10-29 DIAGNOSIS — M5137 Other intervertebral disc degeneration, lumbosacral region: Secondary | ICD-10-CM | POA: Diagnosis not present

## 2019-10-29 DIAGNOSIS — M47816 Spondylosis without myelopathy or radiculopathy, lumbar region: Secondary | ICD-10-CM | POA: Diagnosis not present

## 2019-10-29 DIAGNOSIS — K5790 Diverticulosis of intestine, part unspecified, without perforation or abscess without bleeding: Secondary | ICD-10-CM | POA: Diagnosis not present

## 2019-10-29 DIAGNOSIS — Z471 Aftercare following joint replacement surgery: Secondary | ICD-10-CM | POA: Diagnosis not present

## 2019-10-29 DIAGNOSIS — E119 Type 2 diabetes mellitus without complications: Secondary | ICD-10-CM | POA: Diagnosis not present

## 2019-10-31 DIAGNOSIS — M1612 Unilateral primary osteoarthritis, left hip: Secondary | ICD-10-CM | POA: Diagnosis not present

## 2019-11-14 ENCOUNTER — Ambulatory Visit: Payer: Medicare HMO | Attending: Internal Medicine

## 2019-11-14 DIAGNOSIS — Z23 Encounter for immunization: Secondary | ICD-10-CM | POA: Insufficient documentation

## 2019-11-14 NOTE — Progress Notes (Signed)
   Covid-19 Vaccination Clinic  Name:  CATON TEFFT    MRN: [id number] DOB: 1951/02/06  11/14/2019  Mr. Mcbratney was observed post Covid-19 immunization for 15 minutes without incidence. He was provided with Vaccine Information Sheet and instruction to access the V-Safe system.   Mr. Staib was instructed to call 911 with any severe reactions post vaccine: Marland Kitchen Difficulty breathing  . Swelling of your face and throat  . A fast heartbeat  . A bad rash all over your body  . Dizziness and weakness    Immunizations Administered    Name Date Dose VIS Date Route   Pfizer COVID-19 Vaccine 11/14/2019 12:00 PM 0.3 mL 09/14/2019 Intramuscular   Manufacturer: [place]   Lot: [id number]   [place]am: [id number]

## 2019-11-28 DIAGNOSIS — M1611 Unilateral primary osteoarthritis, right hip: Secondary | ICD-10-CM | POA: Diagnosis not present

## 2019-11-28 DIAGNOSIS — Z96642 Presence of left artificial hip joint: Secondary | ICD-10-CM | POA: Diagnosis not present

## 2020-01-14 DIAGNOSIS — M1611 Unilateral primary osteoarthritis, right hip: Secondary | ICD-10-CM | POA: Diagnosis not present

## 2020-01-23 DIAGNOSIS — M1611 Unilateral primary osteoarthritis, right hip: Secondary | ICD-10-CM | POA: Diagnosis not present

## 2020-01-23 DIAGNOSIS — Z01812 Encounter for preprocedural laboratory examination: Secondary | ICD-10-CM | POA: Diagnosis not present

## 2020-01-23 DIAGNOSIS — M25551 Pain in right hip: Secondary | ICD-10-CM | POA: Diagnosis not present

## 2020-01-23 DIAGNOSIS — R791 Abnormal coagulation profile: Secondary | ICD-10-CM | POA: Diagnosis not present

## 2020-01-30 DIAGNOSIS — M1611 Unilateral primary osteoarthritis, right hip: Secondary | ICD-10-CM | POA: Diagnosis not present

## 2020-01-31 DIAGNOSIS — M1611 Unilateral primary osteoarthritis, right hip: Secondary | ICD-10-CM | POA: Diagnosis not present

## 2020-02-01 DIAGNOSIS — Z96643 Presence of artificial hip joint, bilateral: Secondary | ICD-10-CM | POA: Diagnosis not present

## 2020-02-01 DIAGNOSIS — Z471 Aftercare following joint replacement surgery: Secondary | ICD-10-CM | POA: Diagnosis not present

## 2020-02-01 DIAGNOSIS — Z7982 Long term (current) use of aspirin: Secondary | ICD-10-CM | POA: Diagnosis not present

## 2020-02-04 DIAGNOSIS — Z471 Aftercare following joint replacement surgery: Secondary | ICD-10-CM | POA: Diagnosis not present

## 2020-02-04 DIAGNOSIS — Z96643 Presence of artificial hip joint, bilateral: Secondary | ICD-10-CM | POA: Diagnosis not present

## 2020-02-04 DIAGNOSIS — Z7982 Long term (current) use of aspirin: Secondary | ICD-10-CM | POA: Diagnosis not present

## 2020-02-06 DIAGNOSIS — Z471 Aftercare following joint replacement surgery: Secondary | ICD-10-CM | POA: Diagnosis not present

## 2020-02-06 DIAGNOSIS — Z7982 Long term (current) use of aspirin: Secondary | ICD-10-CM | POA: Diagnosis not present

## 2020-02-06 DIAGNOSIS — Z96643 Presence of artificial hip joint, bilateral: Secondary | ICD-10-CM | POA: Diagnosis not present

## 2020-02-12 DIAGNOSIS — Z96643 Presence of artificial hip joint, bilateral: Secondary | ICD-10-CM | POA: Diagnosis not present

## 2020-02-12 DIAGNOSIS — Z7982 Long term (current) use of aspirin: Secondary | ICD-10-CM | POA: Diagnosis not present

## 2020-02-12 DIAGNOSIS — Z471 Aftercare following joint replacement surgery: Secondary | ICD-10-CM | POA: Diagnosis not present

## 2020-02-13 DIAGNOSIS — M1611 Unilateral primary osteoarthritis, right hip: Secondary | ICD-10-CM | POA: Diagnosis not present

## 2020-02-14 DIAGNOSIS — Z471 Aftercare following joint replacement surgery: Secondary | ICD-10-CM | POA: Diagnosis not present

## 2020-02-14 DIAGNOSIS — Z96643 Presence of artificial hip joint, bilateral: Secondary | ICD-10-CM | POA: Diagnosis not present

## 2020-02-14 DIAGNOSIS — Z7982 Long term (current) use of aspirin: Secondary | ICD-10-CM | POA: Diagnosis not present

## 2020-12-16 ENCOUNTER — Other Ambulatory Visit: Payer: Self-pay

## 2020-12-16 ENCOUNTER — Encounter: Payer: Self-pay | Admitting: Physician Assistant

## 2020-12-16 ENCOUNTER — Ambulatory Visit (INDEPENDENT_AMBULATORY_CARE_PROVIDER_SITE_OTHER): Payer: Medicare HMO | Admitting: Physician Assistant

## 2020-12-16 VITALS — BP 117/73 | HR 52 | Temp 97.6°F | Ht 69.0 in | Wt 194.5 lb

## 2020-12-16 DIAGNOSIS — L821 Other seborrheic keratosis: Secondary | ICD-10-CM

## 2020-12-16 DIAGNOSIS — K200 Eosinophilic esophagitis: Secondary | ICD-10-CM | POA: Diagnosis not present

## 2020-12-16 DIAGNOSIS — H00011 Hordeolum externum right upper eyelid: Secondary | ICD-10-CM | POA: Diagnosis not present

## 2020-12-16 DIAGNOSIS — Z8639 Personal history of other endocrine, nutritional and metabolic disease: Secondary | ICD-10-CM

## 2020-12-16 DIAGNOSIS — E119 Type 2 diabetes mellitus without complications: Secondary | ICD-10-CM

## 2020-12-16 DIAGNOSIS — K2100 Gastro-esophageal reflux disease with esophagitis, without bleeding: Secondary | ICD-10-CM

## 2020-12-16 DIAGNOSIS — Z7689 Persons encountering health services in other specified circumstances: Secondary | ICD-10-CM | POA: Diagnosis not present

## 2020-12-16 MED ORDER — OMEPRAZOLE 40 MG PO CPDR: 40.0000 mg | DELAYED_RELEASE_CAPSULE | Freq: Every day | ORAL | 1 refills | Status: DC

## 2020-12-16 NOTE — Progress Notes (Signed)
New Patient Office Visit  Subjective:  Patient ID: Alex Clark, male    DOB: 18-Jun-1951  Age: 70 y.o. MRN: [id number]  CC:  Chief Complaint  Patient presents with   Establish Care    HPI Alex Clark presents to establish care. PMHx is pertinent for DM, GERD, eosinophilic esophagitis, osteoarthritis, and lumbar DDD. Surgical history is pertinent for total hip replacement of left and right hip and back surgery (08/28/2019). Is requesting refill of omeprazole 40 mg.  States was started on medication by gastroenterologist due to " throat was stretched."  Reports really does not have heartburn symptoms or issues.  Patient reports is managing diabetes with diet.  Initially was on medication which was discontinued since his A1c had significantly improved.  Reports does not check glucose at home.  Tries to monitor carbohydrates and sweets. Denies increased thirst or urination. Family history is pertinent for breast cancer, diabetes mellitus and congestive heart failure.  Patient has never been a smoker, denies alcohol or substance use.  Drinks about 2 cups of soda per day, feels like his diet is good and stays active with doing activities outside or around the house. Has been treating a stye on his right eye with warm compresses which has helped reduce swelling. No fever. Also has a skin lesion he wants checked.  Past Medical History:  Diagnosis Date   Diabetes mellitus without complication ([place])    diet controlled   Diverticulosis    Eosinophilic esophagitis    GERD (gastroesophageal reflux disease)     Past Surgical History:  Procedure Laterality Date   COLONOSCOPY     ESOPHAGOGASTRODUODENOSCOPY     JOINT REPLACEMENT N/A    [employer]reesia 12/13/2020   KNEE ARTHROSCOPY Left 2012   TOTAL HIP ARTHROPLASTY Left 10/16/2019   Procedure: TOTAL HIP ARTHROPLASTY ANTERIOR APPROACH;  Surgeon: Renette Butters, MD;  Location: [facility];  Service: Orthopedics;  Laterality: Left;    Family  History  Problem Relation Age of Onset   Heart disease Mother    Hypertension Mother    Hypertension Father    Cancer Sister    Cancer Maternal Uncle    Colon cancer Neg Hx     Social History   Socioeconomic History   Marital status: Single    Spouse name: Not on file   Number of children: Not on file   Years of education: Not on file   Highest education level: Not on file  Occupational History   Not on file  Tobacco Use   Smoking status: Never Smoker   Smokeless tobacco: Never Used  Vaping Use   Vaping Use: Never used  Substance and Sexual Activity   Alcohol use: No    Alcohol/week: 0.0 standard drinks   Drug use: No   Sexual activity: Yes  Other Topics Concern   Not on file  Social History Narrative   Archivist   Divorced- two children- son lives in [place], [place] an daughter lives in [place]- she has two sons.      Does not have a living will or HPOA- does not want life support if futile.   Social Determinants of Health   Financial Resource Strain: Not on file  Food Insecurity: Not on file  Transportation Needs: Not on file  Physical Activity: Not on file  Stress: Not on file  Social Connections: Not on file  Intimate Partner Violence: Not on file    ROS Review of Systems A fourteen system review of  systems was performed and found to be positive as per HPI.  Objective:   Today's Vitals: BP 117/73    Pulse (!) 52    Temp 97.6 F (36.4 C)    Ht 5\' 9"  (1.753 m)    Wt 194 lb 8 oz (88.2 kg)    SpO2 99%    BMI 28.72 kg/m   Physical Exam General: Pleasant and cooperative, in no acute distress. Neuro:  Alert and oriented,  extra-ocular muscles intact  HEENT:  Normocephalic, atraumatic, red bump over right upper eyelid margin, neck supple,  Skin:  no gross rash, warm, pink. Waxy brown papule over left upper back Cardiac:  RRR, S1 S2 wnl's Respiratory:  ECTA B/L w/o wheezing, Not using accessory muscles, speaking in full sentences-  unlabored. Vascular:  Ext warm, no cyanosis apprec.; cap RF less 2 sec. Psych:  No HI/SI, judgement and insight good, Euthymic mood. Full Affect.   Assessment & Plan:   Problem List Items Addressed This Visit      Digestive   GERD (gastroesophageal reflux disease)   Relevant Medications   omeprazole (PRILOSEC) 40 MG capsule   Eosinophilic esophagitis   Relevant Medications   omeprazole (PRILOSEC) 40 MG capsule    Other Visit Diagnoses    Encounter to establish care    -  Primary   Controlled type 2 diabetes mellitus without complication, without long-term current use of insulin (HCC)       Hordeolum externum of right upper eyelid       Seborrheic keratosis       History of hyperlipidemia, mixed         Controlled type 2 diabetes mellitus without complication, without long-term current use of insulin: -Last A1c 5.8 (10/12/2019). Will repeat A1c with fasting blood work next office visit. -Recommend to reduce sodas and monitor carbohydrates. -Continue to stay as active as possible. -Will continue to monitor.  Eosinophilic esophagitis,  GERD with esophagitis: -Reviewed endoscopy 09/04/2014:  IMPRESSION: 1) Fissures and multiple subtle ring-like mucosa changes in entire esophagus - biopsied to see if eosinophilic esophagitis 2) otherwise normal EGD 3) 54 Fr Maloney dilation completed with mild resistance and trace heme (after biopsies) -Pathology results revealed eosinophilic esophagitis and was started on high dose PPI. -Reviewed last gastroenterology consult ([facility]) 11/12/2014- patient was advised to follow up in 1 year. -Continue current medication regimen. Provided refill. Will discuss with patient to follow up with gastroenterology for maintenance therapy. -Recommend to avoid potential food triggers such as caffeine, milk, eggs etc.  Hordeolum externum of right upper eyelid: -Has improved. Recommend to continue warm compresses. If symptoms fail to improve or worsen with  conservative therapy will start antibiotic ointment.  Seborrheic keratosis: -Reassurance provided. -Recommend to monitor for changes.   Outpatient Encounter Medications as of 12/16/2020  Medication Sig   aspirin EC 81 MG tablet Take 1 tablet (81 mg total) by mouth 2 (two) times daily. For DVT prophylaxis for 30 days after surgery.   [DISCONTINUED] baclofen (LIORESAL) 10 MG tablet Take 1 tablet (10 mg total) by mouth 3 (three) times daily as needed for muscle spasms.   [DISCONTINUED] HYDROcodone-acetaminophen (NORCO) 5-325 MG tablet Take 1-2 tablets by mouth every 6 (six) hours as needed for severe pain.   [DISCONTINUED] omeprazole (PRILOSEC) 40 MG capsule TAKE 1 CAPSULE BY MOUTH DAILY BEFORE BREAKFAST (Patient taking differently: Take 40 mg by mouth daily before breakfast.)   [DISCONTINUED] ondansetron (ZOFRAN) 4 MG tablet Take 1 tablet (4 mg total) by mouth every  8 (eight) hours as needed for nausea or vomiting.   omeprazole (PRILOSEC) 40 MG capsule Take 1 capsule (40 mg total) by mouth daily before breakfast.   No facility-administered encounter medications on file as of 12/16/2020.    Follow-up: Return for DM and FBW few days prior in 2-3 months .   Lorrene Reid, PA-C

## 2020-12-16 NOTE — Patient Instructions (Signed)
Use over the counter debrox ear drops for earwax removal   Earwax Buildup, Adult The ears produce a substance called earwax that helps keep bacteria out of the ear and protects the skin in the ear canal. Occasionally, earwax can build up in the ear and cause discomfort or hearing loss. What are the causes? This condition is caused by a buildup of earwax. Ear canals are self-cleaning. Ear wax is made in the outer part of the ear canal and generally falls out in small amounts over time. When the self-cleaning mechanism is not working, earwax builds up and can cause decreased hearing and discomfort. Attempting to clean ears with cotton swabs can push the earwax deep into the ear canal and cause decreased hearing and pain. What increases the risk? This condition is more likely to develop in people who:  Clean their ears often with cotton swabs.  Pick at their ears.  Use earplugs or in-ear headphones often, or wear hearing aids. The following factors may also make you more likely to develop this condition:  Being male.  Being of older age.  Naturally producing more earwax.  Having narrow ear canals.  Having earwax that is overly thick or sticky.  Having excess hair in the ear canal.  Having eczema.  Being dehydrated. What are the signs or symptoms? Symptoms of this condition include:  Reduced or muffled hearing.  A feeling of fullness in the ear or feeling that the ear is plugged.  Fluid coming from the ear.  Ear pain or an itchy ear.  Ringing in the ear.  Coughing.  Balance problems.  An obvious piece of earwax that can be seen inside the ear canal. How is this diagnosed? This condition may be diagnosed based on:  Your symptoms.  Your medical history.  An ear exam. During the exam, your health care provider will look into your ear with an instrument called an otoscope. You may have tests, including a hearing test. How is this treated? This condition may be  treated by:  Using ear drops to soften the earwax.  Having the earwax removed by a health care provider. The health care provider may: ? Flush the ear with water. ? Use an instrument that has a loop on the end (curette). ? Use a suction device.  Having surgery to remove the wax buildup. This may be done in severe cases. Follow these instructions at home:  Take over-the-counter and prescription medicines only as told by your health care provider.  Do not put any objects, including cotton swabs, into your ear. You can clean the opening of your ear canal with a washcloth or facial tissue.  Follow instructions from your health care provider about cleaning your ears. Do not overclean your ears.  Drink enough fluid to keep your urine pale yellow. This will help to thin the earwax.  Keep all follow-up visits as told. If earwax builds up in your ears often or if you use hearing aids, consider seeing your health care provider for routine, preventive ear cleanings. Ask your health care provider how often you should schedule your cleanings.  If you have hearing aids, clean them according to instructions from the manufacturer and your health care provider.   Contact a health care provider if:  You have ear pain.  You develop a fever.  You have pus or other fluid coming from your ear.  You have hearing loss.  You have ringing in your ears that does not go away.  You  feel like the room is spinning (vertigo).  Your symptoms do not improve with treatment. Get help right away if:  You have bleeding from the affected ear.  You have severe ear pain. Summary  Earwax can build up in the ear and cause discomfort or hearing loss.  The most common symptoms of this condition include reduced or muffled hearing, a feeling of fullness in the ear, or feeling that the ear is plugged.  This condition may be diagnosed based on your symptoms, your medical history, and an ear exam.  This condition  may be treated by using ear drops to soften the earwax or by having the earwax removed by a health care provider.  Do not put any objects, including cotton swabs, into your ear. You can clean the opening of your ear canal with a washcloth or facial tissue. This information is not intended to replace advice given to you by your health care provider. Make sure you discuss any questions you have with your health care provider. Document Revised: 01/08/2020 Document Reviewed: 01/08/2020 Elsevier Patient Education  [place].

## 2021-01-30 DIAGNOSIS — H2513 Age-related nuclear cataract, bilateral: Secondary | ICD-10-CM | POA: Diagnosis not present

## 2021-02-12 DIAGNOSIS — D485 Neoplasm of uncertain behavior of skin: Secondary | ICD-10-CM | POA: Diagnosis not present

## 2021-02-12 DIAGNOSIS — L814 Other melanin hyperpigmentation: Secondary | ICD-10-CM | POA: Diagnosis not present

## 2021-02-12 DIAGNOSIS — L308 Other specified dermatitis: Secondary | ICD-10-CM | POA: Diagnosis not present

## 2021-02-12 DIAGNOSIS — L570 Actinic keratosis: Secondary | ICD-10-CM | POA: Diagnosis not present

## 2021-02-12 DIAGNOSIS — L820 Inflamed seborrheic keratosis: Secondary | ICD-10-CM | POA: Diagnosis not present

## 2021-02-12 DIAGNOSIS — S20462A Insect bite (nonvenomous) of left back wall of thorax, initial encounter: Secondary | ICD-10-CM | POA: Diagnosis not present

## 2021-02-20 ENCOUNTER — Other Ambulatory Visit: Payer: Self-pay

## 2021-02-20 DIAGNOSIS — E119 Type 2 diabetes mellitus without complications: Secondary | ICD-10-CM

## 2021-02-20 DIAGNOSIS — Z8639 Personal history of other endocrine, nutritional and metabolic disease: Secondary | ICD-10-CM

## 2021-02-23 ENCOUNTER — Other Ambulatory Visit: Payer: Self-pay

## 2021-02-23 ENCOUNTER — Other Ambulatory Visit: Payer: Medicare HMO

## 2021-02-23 DIAGNOSIS — E119 Type 2 diabetes mellitus without complications: Secondary | ICD-10-CM | POA: Diagnosis not present

## 2021-02-23 DIAGNOSIS — Z8639 Personal history of other endocrine, nutritional and metabolic disease: Secondary | ICD-10-CM

## 2021-02-24 LAB — CBC WITH DIFFERENTIAL/PLATELET
Basophils Absolute: 0.1 10*3/uL (ref 0.0–0.2)
Basos: 1 %
EOS (ABSOLUTE): 0.4 10*3/uL (ref 0.0–0.4)
Eos: 6 %
Hematocrit: 46 % (ref 37.5–51.0)
Hemoglobin: 15.1 g/dL (ref 13.0–17.7)
Immature Grans (Abs): 0 10*3/uL (ref 0.0–0.1)
Immature Granulocytes: 0 %
Lymphocytes Absolute: 1.9 10*3/uL (ref 0.7–3.1)
Lymphs: 28 %
MCH: 28.7 pg (ref 26.6–33.0)
MCHC: 32.8 g/dL (ref 31.5–35.7)
MCV: 88 fL (ref 79–97)
Monocytes Absolute: 0.9 10*3/uL (ref 0.1–0.9)
Monocytes: 12 %
Neutrophils Absolute: 3.7 10*3/uL (ref 1.4–7.0)
Neutrophils: 53 %
Platelets: 214 10*3/uL (ref 150–450)
RBC: 5.26 x10E6/uL (ref 4.14–5.80)
RDW: 13 % (ref 11.6–15.4)
WBC: 6.9 10*3/uL (ref 3.4–10.8)

## 2021-02-24 LAB — COMPREHENSIVE METABOLIC PANEL
ALT: 13 IU/L (ref 0–44)
AST: 14 IU/L (ref 0–40)
Albumin/Globulin Ratio: 1.8 (ref 1.2–2.2)
Albumin: 4.6 g/dL (ref 3.8–4.8)
Alkaline Phosphatase: 68 IU/L (ref 44–121)
BUN/Creatinine Ratio: 13 (ref 10–24)
BUN: 16 mg/dL (ref 8–27)
Bilirubin Total: 0.5 mg/dL (ref 0.0–1.2)
CO2: 21 mmol/L (ref 20–29)
Calcium: 8.9 mg/dL (ref 8.6–10.2)
Chloride: 102 mmol/L (ref 96–106)
Creatinine, Ser: 1.19 mg/dL (ref 0.76–1.27)
Globulin, Total: 2.5 g/dL (ref 1.5–4.5)
Glucose: 109 mg/dL — ABNORMAL HIGH (ref 65–99)
Potassium: 4.5 mmol/L (ref 3.5–5.2)
Sodium: 140 mmol/L (ref 134–144)
Total Protein: 7.1 g/dL (ref 6.0–8.5)
eGFR: 67 mL/min/{1.73_m2} (ref >59)

## 2021-02-24 LAB — TSH: TSH: 2.2 u[IU]/mL (ref 0.450–4.500)

## 2021-02-24 LAB — LIPID PANEL
Chol/HDL Ratio: 5.3 ratio — ABNORMAL HIGH (ref 0.0–5.0)
Cholesterol, Total: 201 mg/dL — ABNORMAL HIGH (ref 100–199)
HDL: 38 mg/dL — ABNORMAL LOW (ref >39)
LDL Chol Calc (NIH): 128 mg/dL — ABNORMAL HIGH (ref 0–99)
Triglycerides: 196 mg/dL — ABNORMAL HIGH (ref 0–149)
VLDL Cholesterol Cal: 35 mg/dL (ref 5–40)

## 2021-02-24 LAB — HEMOGLOBIN A1C
Est. average glucose Bld gHb Est-mCnc: 140 mg/dL
Hgb A1c MFr Bld: 6.5 % — ABNORMAL HIGH (ref 4.8–5.6)

## 2021-02-25 ENCOUNTER — Encounter: Payer: Self-pay | Admitting: Physician Assistant

## 2021-02-25 ENCOUNTER — Other Ambulatory Visit: Payer: Self-pay

## 2021-02-25 ENCOUNTER — Ambulatory Visit (INDEPENDENT_AMBULATORY_CARE_PROVIDER_SITE_OTHER): Payer: Medicare HMO | Admitting: Physician Assistant

## 2021-02-25 VITALS — BP 132/84 | HR 56 | Temp 98.2°F | Ht 71.0 in | Wt 192.7 lb

## 2021-02-25 DIAGNOSIS — E119 Type 2 diabetes mellitus without complications: Secondary | ICD-10-CM

## 2021-02-25 DIAGNOSIS — E785 Hyperlipidemia, unspecified: Secondary | ICD-10-CM

## 2021-02-25 DIAGNOSIS — L738 Other specified follicular disorders: Secondary | ICD-10-CM | POA: Insufficient documentation

## 2021-02-25 DIAGNOSIS — E1169 Type 2 diabetes mellitus with other specified complication: Secondary | ICD-10-CM | POA: Diagnosis not present

## 2021-02-25 LAB — POCT UA - MICROALBUMIN
Albumin/Creatinine Ratio, Urine, POC: <30
Creatinine, POC: 100 mg/dL
Microalbumin Ur, POC: 30 mg/L

## 2021-02-25 MED ORDER — METFORMIN HCL 500 MG PO TABS: 500.0000 mg | ORAL_TABLET | Freq: Every day | ORAL | 0 refills | Status: DC

## 2021-02-25 NOTE — Progress Notes (Signed)
Established Patient Office Visit  Subjective:  Patient ID: Alex Clark, male    DOB: Mar 10, 1951  Age: 70 y.o. MRN: [id number]  CC:  Chief Complaint  Patient presents with  . Follow-up  . Diabetes    HPI HARCE VOLDEN presents for follow up on diabetes mellitus and hyperlipidemia.  Patient reports is currently taking antibiotic for recurrent right eye stye.  Diabetes: Pt denies increased urination or thirst.  Reports has not been as diligent with monitoring carbohydrate and glucose intake.  Continues to stay as active as possible with activities around the house.  HLD: Pt has not been on statin therapy in the past.  Reports his diet does incorporate fats including cheese.   Past Medical History:  Diagnosis Date  . Diabetes mellitus without complication (HCC)    diet controlled  . Diverticulosis   . Eosinophilic esophagitis   . GERD (gastroesophageal reflux disease)     Past Surgical History:  Procedure Laterality Date  . COLONOSCOPY    . ESOPHAGOGASTRODUODENOSCOPY    . JOINT REPLACEMENT N/A    Phreesia 12/13/2020  . KNEE ARTHROSCOPY Left 2012  . TOTAL HIP ARTHROPLASTY Left 10/16/2019   Procedure: TOTAL HIP ARTHROPLASTY ANTERIOR APPROACH;  Surgeon: Renette Butters, MD;  Location: [facility];  Service: Orthopedics;  Laterality: Left;    Family History  Problem Relation Age of Onset  . Heart disease Mother   . Hypertension Mother   . Hypertension Father   . Cancer Sister   . Cancer Maternal Uncle   . Colon cancer Neg Hx     Social History   Socioeconomic History  . Marital status: Single    Spouse name: Not on file  . Number of children: Not on file  . Years of education: Not on file  . Highest education level: Not on file  Occupational History  . Not on file  Tobacco Use  . Smoking status: Never Smoker  . Smokeless tobacco: Never Used  Vaping Use  . Vaping Use: Never used  Substance and Sexual Activity  . Alcohol use: No    Alcohol/week: 0.0  standard drinks  . Drug use: No  . Sexual activity: Yes  Other Topics Concern  . 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      Does not have a living will or HPOA- does not want life support if futile.   Social Determinants of Health   Financial Resource Strain: Not on file  Food Insecurity: Not on file  Transportation Needs: Not on file  Physical Activity: Not on file  Stress: Not on file  Social Connections: Not on file  Intimate Partner Violence: Not on file    Outpatient Medications Prior to Visit  Medication Sig Dispense Refill  . aspirin EC 81 MG tablet Take 1 tablet (81 mg total) by mouth 2 (two) times daily. For DVT prophylaxis for 30 days after surgery. 60 tablet 0  . dicloxacillin (DYNAPEN) 250 MG capsule Take 250 mg by mouth 4 (four) times daily.    Marland Kitchen omeprazole (PRILOSEC) 40 MG capsule Take 1 capsule (40 mg total) by mouth daily before breakfast. 90 capsule 1   No facility-administered medications prior to visit.    Allergies  Allergen Reactions  . Pantoprazole Sodium Other (See Comments)    Headache    ROS Review of Systems A fourteen  system review of systems was performed and found to be positive as per HPI.  Objective:    Physical Exam General:  Well Developed, well nourished, appropriate for stated age.  Neuro:  Alert and oriented,  extra-ocular muscles intact  HEENT:  Normocephalic, atraumatic, healing stye of upper right eyelid, neck supple Skin:  no gross rash, warm, pink. Cardiac:  RRR, S1 S2 Respiratory:  ECTA B/L, no wheezing, crackles or rales, Not using accessory muscles, speaking in full sentences- unlabored. Vascular:  Ext warm, no cyanosis apprec.; cap RF less 2 sec. no edema Psych:  No HI/SI, judgement and insight good, Euthymic mood. Full Affect.   BP 132/84   Pulse (!) 56   Temp 98.2 F (36.8 C)   Ht '5\' 11"'  (1.803 m)   Wt  192 lb 11.2 oz (87.4 kg)   SpO2 100%   BMI 26.88 kg/m  Wt Readings from Last 3 Encounters:  02/25/21 192 lb 11.2 oz (87.4 kg)  12/16/20 194 lb 8 oz (88.2 kg)  10/16/19 182 lb 9.6 oz (82.8 kg)     Health Maintenance Due  Topic Date Due  . OPHTHALMOLOGY EXAM  Never done  . Zoster Vaccines- Shingrix (1 of 2) Never done  . COVID-19 Vaccine (3 - Pfizer risk 4-dose series) 12/12/2019  . PNA vac Low Risk Adult (2 of 2 - PPSV23) 05/20/2020    There are no preventive care reminders to display for this patient.  Lab Results  Component Value Date   TSH 2.200 02/23/2021   Lab Results  Component Value Date   WBC 6.9 02/23/2021   HGB 15.1 02/23/2021   HCT 46.0 02/23/2021   MCV 88 02/23/2021   PLT 214 02/23/2021   Lab Results  Component Value Date   NA 140 02/23/2021   K 4.5 02/23/2021   CO2 21 02/23/2021   GLUCOSE 109 (H) 02/23/2021   BUN 16 02/23/2021   CREATININE 1.19 02/23/2021   BILITOT 0.5 02/23/2021   ALKPHOS 68 02/23/2021   AST 14 02/23/2021   ALT 13 02/23/2021   PROT 7.1 02/23/2021   ALBUMIN 4.6 02/23/2021   CALCIUM 8.9 02/23/2021   ANIONGAP 9 10/12/2019   EGFR 67 02/23/2021   GFR 72.27 04/04/2018   Lab Results  Component Value Date   CHOL 201 (H) 02/23/2021   Lab Results  Component Value Date   HDL 38 (L) 02/23/2021   Lab Results  Component Value Date   LDLCALC 128 (H) 02/23/2021   Lab Results  Component Value Date   TRIG 196 (H) 02/23/2021   Lab Results  Component Value Date   CHOLHDL 5.3 (H) 02/23/2021   Lab Results  Component Value Date   HGBA1C 6.5 (H) 02/23/2021      Assessment & Plan:   Problem List Items Addressed This Visit      Endocrine   Diabetes mellitus ([place]) - Primary   Relevant Medications   metFORMIN (GLUCOPHAGE) 500 MG tablet   Other Relevant Orders   POCT UA - Microalbumin (Completed)    Other Visit Diagnoses    Hyperlipidemia associated with type 2 diabetes mellitus ([place])       Relevant Medications   metFORMIN  (GLUCOPHAGE) 500 MG tablet     Diabetes mellitus type 2: -Discussed with patient most recent lab results, most are essentially within normal limits or stable from prior with the exception of A1c and lipid panel.  A1c has increased from 5.8 to 6.5.  Patient is agreeable to restarting metformin  500 mg daily.  Recommend ambulatory glucose monitoring.  Discussed reducing carbohydrates and glucose intake. -UA microalbumin collected today, A:C <30 mg/g -Follow-up in 3 months.  Hyperlipidemia associated with type 2 diabetes mellitus: -Recent lipid panel: Total cholesterol 201, 2 glycerides 196, HDL 38, LDL 128 -The 10-year ASCVD risk score Mikey Bussing DC Jr., et al., 2013) is: 31.5% -Discussed with patient management options and declined starting statin therapy at this time.  Prefers to start dietary and lifestyle modifications. Will repeat lipid panel at follow-up visit and recommend considering starting statin therapy if lipid panel fails to improve. Patient verbalized understanding. -Follow-up in 3 months  Meds ordered this encounter  Medications  . metFORMIN (GLUCOPHAGE) 500 MG tablet    Sig: Take 1 tablet (500 mg total) by mouth daily with breakfast.    Dispense:  90 tablet    Refill:  0    Order Specific Question:   Supervising Provider    Answer:   Beatrice Lecher D [[id number]]    Follow-up: Return in about 3 months (around 05/28/2021) for DM, HLD and FBW (lipid panel, cmp, a1c) few days prior .   Note:  This note was prepared with assistance of Dragon voice recognition software. Occasional wrong-word or sound-a-like substitutions may have occurred due to the inherent limitations of voice recognition software.  Lorrene Reid, PA-C

## 2021-02-25 NOTE — Patient Instructions (Signed)

## 2021-05-27 ENCOUNTER — Other Ambulatory Visit: Payer: Self-pay | Admitting: Physician Assistant

## 2021-05-27 DIAGNOSIS — E1169 Type 2 diabetes mellitus with other specified complication: Secondary | ICD-10-CM

## 2021-05-27 DIAGNOSIS — Z0000 Encounter for general adult medical examination without abnormal findings: Secondary | ICD-10-CM

## 2021-05-27 DIAGNOSIS — E785 Hyperlipidemia, unspecified: Secondary | ICD-10-CM

## 2021-06-04 ENCOUNTER — Other Ambulatory Visit: Payer: Self-pay

## 2021-06-04 ENCOUNTER — Other Ambulatory Visit: Payer: Medicare HMO

## 2021-06-04 DIAGNOSIS — Z0000 Encounter for general adult medical examination without abnormal findings: Secondary | ICD-10-CM | POA: Diagnosis not present

## 2021-06-04 DIAGNOSIS — E1169 Type 2 diabetes mellitus with other specified complication: Secondary | ICD-10-CM

## 2021-06-04 DIAGNOSIS — E785 Hyperlipidemia, unspecified: Secondary | ICD-10-CM | POA: Diagnosis not present

## 2021-06-05 LAB — COMPREHENSIVE METABOLIC PANEL
ALT: 15 IU/L (ref 0–44)
AST: 18 IU/L (ref 0–40)
Albumin/Globulin Ratio: 1.8 (ref 1.2–2.2)
Albumin: 4.4 g/dL (ref 3.8–4.8)
Alkaline Phosphatase: 60 IU/L (ref 44–121)
BUN/Creatinine Ratio: 10 (ref 10–24)
BUN: 11 mg/dL (ref 8–27)
Bilirubin Total: 0.6 mg/dL (ref 0.0–1.2)
CO2: 24 mmol/L (ref 20–29)
Calcium: 9.1 mg/dL (ref 8.6–10.2)
Chloride: 104 mmol/L (ref 96–106)
Creatinine, Ser: 1.14 mg/dL (ref 0.76–1.27)
Globulin, Total: 2.5 g/dL (ref 1.5–4.5)
Glucose: 118 mg/dL — ABNORMAL HIGH (ref 65–99)
Potassium: 4.4 mmol/L (ref 3.5–5.2)
Sodium: 141 mmol/L (ref 134–144)
Total Protein: 6.9 g/dL (ref 6.0–8.5)
eGFR: 70 mL/min/{1.73_m2} (ref >59)

## 2021-06-05 LAB — LIPID PANEL
Chol/HDL Ratio: 4.9 ratio (ref 0.0–5.0)
Cholesterol, Total: 178 mg/dL (ref 100–199)
HDL: 36 mg/dL — ABNORMAL LOW (ref >39)
LDL Chol Calc (NIH): 118 mg/dL — ABNORMAL HIGH (ref 0–99)
Triglycerides: 133 mg/dL (ref 0–149)
VLDL Cholesterol Cal: 24 mg/dL (ref 5–40)

## 2021-06-05 LAB — HEMOGLOBIN A1C
Est. average glucose Bld gHb Est-mCnc: 131 mg/dL
Hgb A1c MFr Bld: 6.2 % — ABNORMAL HIGH (ref 4.8–5.6)

## 2021-06-05 NOTE — Progress Notes (Signed)
Established Patient Office Visit  Subjective:  Patient ID: Alex Clark, male    DOB: 1951-08-05  Age: 70 y.o. MRN: [id number]  CC:  Chief Complaint  Patient presents with   Follow-up   Diabetes   Hyperlipidemia   HPI Alex Clark presents for DM, HLD. Patient reports has decreased fried foods, red meat and is working on cutting back on carbohydrates. States does want to start cholesterol medication. Denies chest pain, palpitations, shortness of breath or lower extremity swelling.   Diabetic Eye Exam: UTD, will request records from Dr. Samara Snide [facility]) phone: [phone]  Diabetes Mellitus Type II, Follow-up:   Lab Results  Component Value Date   HGBA1C 5.8 (A) 06/10/2021   HGBA1C 6.2 (H) 06/04/2021   HGBA1C 6.5 (H) 02/23/2021    Last seen for diabetes 3 months ago.  Management since then includes medication and lifestyle. He reports excellent compliance with treatment. He is not having side effects.  Current symptoms include none and have been stable. Home blood sugar records: Does not check  Episodes of hypoglycemia? 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  Current diet habits: in general, a "healthy" diet   Drinking water with [place], 4-5 glasses a day  Pertinent Labs:    Component Value Date/Time   CHOL 178 06/04/2021 0955   TRIG 133 06/04/2021 0955   HDL 36 (L) 06/04/2021 0955   LDLCALC 118 (H) 06/04/2021 0955   CREATININE 1.14 06/04/2021 0955    Wt Readings from Last 3 Encounters:  06/10/21 189 lb (85.7 kg)  02/25/21 192 lb 11.2 oz (87.4 kg)  12/16/20 194 lb 8 oz (88.2 kg)    ------------------------------------------------------------------------    Past Medical History:  Diagnosis Date   Diabetes mellitus without complication ([place])    diet controlled    Diverticulosis    Eosinophilic esophagitis    GERD (gastroesophageal reflux disease)     Past Surgical History:  Procedure Laterality Date   COLONOSCOPY     ESOPHAGOGASTRODUODENOSCOPY     JOINT REPLACEMENT N/A    Phreesia 12/13/2020   KNEE ARTHROSCOPY Left 2012   TOTAL HIP ARTHROPLASTY Left 10/16/2019   Procedure: TOTAL HIP ARTHROPLASTY ANTERIOR APPROACH;  Surgeon: Renette Butters, MD;  Location: [facility];  Service: Orthopedics;  Laterality: Left;    Family History  Problem Relation Age of Onset   Heart disease Mother    Hypertension Mother    Hypertension Father    Cancer Sister    Cancer Maternal Uncle    Colon cancer Neg Hx     Social History   Socioeconomic History   Marital status: Married    Spouse name: Not on file   Number of children: Not on file   Years of education: Not on file   Highest education level: Not on file  Occupational History   Not on file  Tobacco Use   Smoking status: Never   Smokeless tobacco: Never  Vaping Use   Vaping Use: Never used  Substance and Sexual Activity   Alcohol use: No    Alcohol/week: 0.0 standard drinks   Drug use: No   Sexual activity: Yes  Other Topics Concern   Not on file  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      Does not have a living will or HPOA- does not want life support if futile.   Social Determinants of Health   Financial Resource Strain: Not on file  Food Insecurity: Not on file  Transportation Needs: Not on file  Physical Activity: Not on file  Stress: Not on file  Social Connections: Not on file  Intimate Partner Violence: Not on file    Outpatient Medications Prior to Visit  Medication Sig Dispense Refill   aspirin EC 81 MG tablet Take 1 tablet (81 mg total) by mouth 2 (two) times daily. For DVT prophylaxis for 30 days after surgery. 60 tablet 0   dicloxacillin (DYNAPEN) 250 MG capsule Take 250  mg by mouth 4 (four) times daily.     metFORMIN (GLUCOPHAGE) 500 MG tablet Take 1 tablet (500 mg total) by mouth daily with breakfast. 90 tablet 0   omeprazole (PRILOSEC) 40 MG capsule Take 1 capsule (40 mg total) by mouth daily before breakfast. 90 capsule 1   No facility-administered medications prior to visit.    Allergies  Allergen Reactions   Pantoprazole Sodium Other (See Comments)    Headache    ROS Review of Systems A fourteen system review of systems was performed and found to be positive as per HPI.  Objective:    Physical Exam General:  Well Developed, well nourished, appropriate for stated age.  Neuro:  Alert and oriented,  extra-ocular muscles intact  HEENT:  Normocephalic, atraumatic, neck supple, no carotid bruits appreciated  Skin:  no gross rash, warm, pink. Cardiac:  RRR, S1 S2, no murmur  Respiratory:  CTA B/L w/o wheezing, crackles or rales, Not using accessory muscles, speaking in full sentences- unlabored. Vascular:  Ext warm, no cyanosis apprec.; cap RF less 2 sec. Psych:  No HI/SI, judgement and insight good, Euthymic mood. Full Affect.  BP 119/73   Pulse 66   Temp 98.5 F (36.9 C)   Ht '5\' 10"'  (1.778 m)   Wt 189 lb (85.7 kg)   SpO2 98%   BMI 27.12 kg/m  Wt Readings from Last 3 Encounters:  06/10/21 189 lb (85.7 kg)  02/25/21 192 lb 11.2 oz (87.4 kg)  12/16/20 194 lb 8 oz (88.2 kg)     Health Maintenance Due  Topic Date Due   OPHTHALMOLOGY EXAM  Never done   Zoster Vaccines- Shingrix (1 of 2) Never done   COVID-19 Vaccine (3 - Pfizer risk series) 12/12/2019   PNA vac Low Risk Adult (2 of 2 - PPSV23) 05/20/2020   INFLUENZA VACCINE  05/04/2021    There are no preventive care reminders to display for this patient.  Lab Results  Component Value Date   TSH 2.200 02/23/2021   Lab Results  Component Value Date   WBC 6.9 02/23/2021   HGB 15.1 02/23/2021   HCT 46.0 02/23/2021   MCV 88 02/23/2021   PLT 214 02/23/2021   Lab Results   Component Value Date   NA 141 06/04/2021   K 4.4 06/04/2021   CO2 24 06/04/2021   GLUCOSE 118 (H) 06/04/2021   BUN 11 06/04/2021   CREATININE 1.14 06/04/2021   BILITOT 0.6 06/04/2021   ALKPHOS 60 06/04/2021   AST 18 06/04/2021   ALT 15 06/04/2021   PROT 6.9 06/04/2021   ALBUMIN 4.4 06/04/2021   CALCIUM 9.1 06/04/2021   ANIONGAP 9 10/12/2019   EGFR 70 06/04/2021   GFR 72.27  04/04/2018   Lab Results  Component Value Date   CHOL 178 06/04/2021   Lab Results  Component Value Date   HDL 36 (L) 06/04/2021   Lab Results  Component Value Date   LDLCALC 118 (H) 06/04/2021   Lab Results  Component Value Date   TRIG 133 06/04/2021   Lab Results  Component Value Date   CHOLHDL 4.9 06/04/2021   Lab Results  Component Value Date   HGBA1C 5.8 (A) 06/10/2021      Assessment & Plan:   Problem List Items Addressed This Visit       Digestive   Eosinophilic esophagitis    -Controlled. Discussed with patient can trial PPI therapy as needed basis vs daily. Recommend to continue to avoid provocative food triggers.      Relevant Medications   omeprazole (PRILOSEC) 40 MG capsule     Endocrine   Diabetes mellitus ([place]) - Primary    -Recent A1c has improved, will continue current medication regimen.  -Encourage to continue with diet changes and reduce simple carbohydrates. -Will request eye exam. -Will continue to monitor.      Relevant Medications   metFORMIN (GLUCOPHAGE) 500 MG tablet   Other Relevant Orders   POCT glycosylated hemoglobin (Hb A1C) (Completed)   Hyperlipidemia associated with type 2 diabetes mellitus ([place])    -Discussed recent lipid panel: LDL has improved but remain above goal of <70. Recommend to continue with low fat diet and reduce carbohydrates. Will repeat lipid panel at follow up visit, if LDL continues to be above goal recommend to consider statin therapy.       Relevant Medications   metFORMIN (GLUCOPHAGE) 500 MG tablet    Meds ordered  this encounter  Medications   metFORMIN (GLUCOPHAGE) 500 MG tablet    Sig: Take 1 tablet (500 mg total) by mouth daily with breakfast.    Dispense:  90 tablet    Refill:  1    Order Specific Question:   Supervising Provider    Answer:   Beatrice Lecher D [[id number]]   omeprazole (PRILOSEC) 40 MG capsule    Sig: Take 1 capsule (40 mg total) by mouth daily before breakfast.    Dispense:  90 capsule    Refill:  0    Order Specific Question:   Supervising Provider    Answer:   Beatrice Lecher D [[id number]]     Follow-up: Return in about 4 months (around 10/10/2021) for DM, HLD, GERD and FBW (lipid panel, cmp, a1c) few days prior.    Lorrene Reid, PA-C

## 2021-06-05 NOTE — Patient Instructions (Signed)
Diabetes Mellitus Emergency Preparedness Plan ?A diabetes emergency preparedness plan is a checklist to make sure you have everything you need to manage your diabetes in case of an emergency, such as an evacuation, natural disaster, national security emergency, or pandemic lockdown. ?Managing your diabetes is something you have to do all day every day. The American Diabetes Association and the American College of Endocrinology both recommend putting together an emergency diabetes kit. Your kit should include important information and documents as well as all the supplies you will need to manage your diabetes for at least 1 week. Store it in a portable, waterproof bag or container. The best time to start making your emergency kit is now. ?How to make your emergency kit ?Collect information and documents ?Include the following information and documents in your kit: ?The type of diabetes you have. ?A copy of your health insurance cards and photo ID. ?A list of all your other medical conditions, allergies, and surgeries. ?A list of all your medicines and doses with the contact information for your pharmacy. Ask your health care provider for a list of your current medicines. ?Any recent lab results, including your latest hemoglobin A1C (HbA1C). ?The make, model, and serial number of your insulin pump, if you use one. Also include contact information for the manufacturer. ?Contact information for people who should be notified in case of an emergency. Include your health care provider's name, address, and phone number. ?Collect diabetes care items ?Include the following diabetes care items in your kit: ?At least a 1-week supply of: ?Oral medicines. ?Insulin. ?Blood glucose testing supplies. These include testing strips, lancets, and extra batteries for your blood glucose monitor and pump. ?A charger for the continuous glucose monitor (CGM) receiver and pump. ?Any extra supplies needed for your CGM or pump. ?A supply of  glucagon, glucose tablets, juice, soda, or hard candy in case of hypoglycemia. ?Coolers or cold packs. ?A safe container for syringes, needles, and lancets. ? ?Other preparations ?Other things to consider doing as part of your emergency plan: ?Make sure that your mobile phone is charged and that you have an extra charger, cable, or batteries. ?Choose a meeting place for family members. ?Wear a medical alert or ID bracelet. ?If you have a child with diabetes, make sure your child's school has a copy of his or her emergency plan, including the name of the staff member who will assist your child. ?Where to find more information ?American Diabetes Association: www.diabetes.org ?Centers for Disease Control and Prevention: blogs.cdc.gov ?Summary ?A diabetes emergency preparedness plan is a checklist to make sure you have everything you need in case of an emergency. ?Your kit should include important information and documents as well as all the supplies you will need to manage your condition for at least 1 week. ?Store your kit in a portable, waterproof bag or container. ?The best time to start making your emergency kit is now. ?This information is not intended to replace advice given to you by your health care provider. Make sure you discuss any questions you have with your health care provider. ?Document Revised: 03/27/2020 Document Reviewed: 03/27/2020 ?Elsevier Patient Education ? 2022 Elsevier Inc. ? ?

## 2021-06-09 ENCOUNTER — Ambulatory Visit: Payer: Medicare HMO | Admitting: Physician Assistant

## 2021-06-10 ENCOUNTER — Other Ambulatory Visit: Payer: Self-pay

## 2021-06-10 ENCOUNTER — Encounter: Payer: Self-pay | Admitting: Physician Assistant

## 2021-06-10 ENCOUNTER — Ambulatory Visit (INDEPENDENT_AMBULATORY_CARE_PROVIDER_SITE_OTHER): Payer: Medicare HMO | Admitting: Physician Assistant

## 2021-06-10 VITALS — BP 119/73 | HR 66 | Temp 98.5°F | Ht 70.0 in | Wt 189.0 lb

## 2021-06-10 DIAGNOSIS — E1169 Type 2 diabetes mellitus with other specified complication: Secondary | ICD-10-CM | POA: Diagnosis not present

## 2021-06-10 DIAGNOSIS — E785 Hyperlipidemia, unspecified: Secondary | ICD-10-CM | POA: Diagnosis not present

## 2021-06-10 DIAGNOSIS — K200 Eosinophilic esophagitis: Secondary | ICD-10-CM

## 2021-06-10 LAB — POCT GLYCOSYLATED HEMOGLOBIN (HGB A1C): Hemoglobin A1C: 5.8 % — AB (ref 4.0–5.6)

## 2021-06-10 MED ORDER — METFORMIN HCL 500 MG PO TABS: 500.0000 mg | ORAL_TABLET | Freq: Every day | ORAL | 1 refills | Status: DC

## 2021-06-10 MED ORDER — OMEPRAZOLE 40 MG PO CPDR: 40.0000 mg | DELAYED_RELEASE_CAPSULE | Freq: Every day | ORAL | 0 refills | Status: DC

## 2021-06-10 NOTE — Assessment & Plan Note (Signed)
-  Controlled. Discussed with patient can trial PPI therapy as needed basis vs daily. Recommend to continue to avoid provocative food triggers.

## 2021-06-10 NOTE — Assessment & Plan Note (Signed)
-  Discussed recent lipid panel: LDL has improved but remain above goal of <70. Recommend to continue with low fat diet and reduce carbohydrates. Will repeat lipid panel at follow up visit, if LDL continues to be above goal recommend to consider statin therapy.

## 2021-06-10 NOTE — Assessment & Plan Note (Addendum)
-  Recent A1c has improved, will continue current medication regimen. CMP, renal function normal. -Encourage to continue with diet changes and reduce simple carbohydrates. -Will request eye exam. -Will continue to monitor.

## 2021-06-24 ENCOUNTER — Telehealth: Payer: Self-pay | Admitting: Physician Assistant

## 2021-06-24 NOTE — Telephone Encounter (Signed)
Pt is willing to schedule AWV but he prefers to be scheduled on a Wed. Because he does not work on those days

## 2021-10-26 ENCOUNTER — Other Ambulatory Visit: Payer: Self-pay | Admitting: Physician Assistant

## 2021-10-26 DIAGNOSIS — K200 Eosinophilic esophagitis: Secondary | ICD-10-CM

## 2021-12-07 ENCOUNTER — Other Ambulatory Visit: Payer: Self-pay | Admitting: Physician Assistant

## 2021-12-07 DIAGNOSIS — E1169 Type 2 diabetes mellitus with other specified complication: Secondary | ICD-10-CM

## 2022-02-12 DIAGNOSIS — M25561 Pain in right knee: Secondary | ICD-10-CM | POA: Diagnosis not present

## 2022-02-12 DIAGNOSIS — M25511 Pain in right shoulder: Secondary | ICD-10-CM | POA: Diagnosis not present

## 2022-03-08 ENCOUNTER — Other Ambulatory Visit: Payer: Self-pay | Admitting: Physician Assistant

## 2022-03-08 DIAGNOSIS — E1169 Type 2 diabetes mellitus with other specified complication: Secondary | ICD-10-CM

## 2022-03-08 DIAGNOSIS — K200 Eosinophilic esophagitis: Secondary | ICD-10-CM

## 2022-03-26 DIAGNOSIS — M25511 Pain in right shoulder: Secondary | ICD-10-CM | POA: Diagnosis not present

## 2022-04-03 DIAGNOSIS — M25511 Pain in right shoulder: Secondary | ICD-10-CM | POA: Diagnosis not present

## 2022-04-14 DIAGNOSIS — M25511 Pain in right shoulder: Secondary | ICD-10-CM | POA: Diagnosis not present

## 2022-04-22 DIAGNOSIS — M5416 Radiculopathy, lumbar region: Secondary | ICD-10-CM | POA: Diagnosis not present

## 2022-04-22 DIAGNOSIS — Z6826 Body mass index (BMI) 26.0-26.9, adult: Secondary | ICD-10-CM | POA: Diagnosis not present

## 2022-06-10 ENCOUNTER — Other Ambulatory Visit: Payer: Self-pay | Admitting: Physician Assistant

## 2022-06-10 DIAGNOSIS — E1169 Type 2 diabetes mellitus with other specified complication: Secondary | ICD-10-CM

## 2022-06-10 DIAGNOSIS — K200 Eosinophilic esophagitis: Secondary | ICD-10-CM

## 2022-07-01 DIAGNOSIS — M24011 Loose body in right shoulder: Secondary | ICD-10-CM | POA: Diagnosis not present

## 2022-07-01 DIAGNOSIS — M19011 Primary osteoarthritis, right shoulder: Secondary | ICD-10-CM | POA: Diagnosis not present

## 2022-07-01 DIAGNOSIS — M7521 Bicipital tendinitis, right shoulder: Secondary | ICD-10-CM | POA: Diagnosis not present

## 2022-07-01 DIAGNOSIS — M7551 Bursitis of right shoulder: Secondary | ICD-10-CM | POA: Diagnosis not present

## 2022-07-01 DIAGNOSIS — M7541 Impingement syndrome of right shoulder: Secondary | ICD-10-CM | POA: Diagnosis not present

## 2022-07-01 DIAGNOSIS — G8918 Other acute postprocedural pain: Secondary | ICD-10-CM | POA: Diagnosis not present

## 2022-07-01 DIAGNOSIS — S46011A Strain of muscle(s) and tendon(s) of the rotator cuff of right shoulder, initial encounter: Secondary | ICD-10-CM | POA: Diagnosis not present

## 2022-07-01 DIAGNOSIS — S46811A Strain of other muscles, fascia and tendons at shoulder and upper arm level, right arm, initial encounter: Secondary | ICD-10-CM | POA: Diagnosis not present

## 2022-07-12 DIAGNOSIS — M19011 Primary osteoarthritis, right shoulder: Secondary | ICD-10-CM | POA: Diagnosis not present

## 2022-07-21 DIAGNOSIS — S46111D Strain of muscle, fascia and tendon of long head of biceps, right arm, subsequent encounter: Secondary | ICD-10-CM | POA: Diagnosis not present

## 2022-07-21 DIAGNOSIS — M25611 Stiffness of right shoulder, not elsewhere classified: Secondary | ICD-10-CM | POA: Diagnosis not present

## 2022-07-21 DIAGNOSIS — M6281 Muscle weakness (generalized): Secondary | ICD-10-CM | POA: Diagnosis not present

## 2022-07-21 DIAGNOSIS — M7541 Impingement syndrome of right shoulder: Secondary | ICD-10-CM | POA: Diagnosis not present

## 2022-07-21 DIAGNOSIS — S46011D Strain of muscle(s) and tendon(s) of the rotator cuff of right shoulder, subsequent encounter: Secondary | ICD-10-CM | POA: Diagnosis not present

## 2022-07-24 DIAGNOSIS — M25611 Stiffness of right shoulder, not elsewhere classified: Secondary | ICD-10-CM | POA: Diagnosis not present

## 2022-07-24 DIAGNOSIS — S46111D Strain of muscle, fascia and tendon of long head of biceps, right arm, subsequent encounter: Secondary | ICD-10-CM | POA: Diagnosis not present

## 2022-07-24 DIAGNOSIS — M7541 Impingement syndrome of right shoulder: Secondary | ICD-10-CM | POA: Diagnosis not present

## 2022-07-24 DIAGNOSIS — M6281 Muscle weakness (generalized): Secondary | ICD-10-CM | POA: Diagnosis not present

## 2022-07-24 DIAGNOSIS — S46011D Strain of muscle(s) and tendon(s) of the rotator cuff of right shoulder, subsequent encounter: Secondary | ICD-10-CM | POA: Diagnosis not present

## 2022-07-27 DIAGNOSIS — M7541 Impingement syndrome of right shoulder: Secondary | ICD-10-CM | POA: Diagnosis not present

## 2022-07-27 DIAGNOSIS — S46111D Strain of muscle, fascia and tendon of long head of biceps, right arm, subsequent encounter: Secondary | ICD-10-CM | POA: Diagnosis not present

## 2022-07-27 DIAGNOSIS — M6281 Muscle weakness (generalized): Secondary | ICD-10-CM | POA: Diagnosis not present

## 2022-07-27 DIAGNOSIS — S46011D Strain of muscle(s) and tendon(s) of the rotator cuff of right shoulder, subsequent encounter: Secondary | ICD-10-CM | POA: Diagnosis not present

## 2022-07-27 DIAGNOSIS — M25611 Stiffness of right shoulder, not elsewhere classified: Secondary | ICD-10-CM | POA: Diagnosis not present

## 2022-08-02 DIAGNOSIS — M7541 Impingement syndrome of right shoulder: Secondary | ICD-10-CM | POA: Diagnosis not present

## 2022-08-02 DIAGNOSIS — M6281 Muscle weakness (generalized): Secondary | ICD-10-CM | POA: Diagnosis not present

## 2022-08-02 DIAGNOSIS — M25611 Stiffness of right shoulder, not elsewhere classified: Secondary | ICD-10-CM | POA: Diagnosis not present

## 2022-08-02 DIAGNOSIS — S46011D Strain of muscle(s) and tendon(s) of the rotator cuff of right shoulder, subsequent encounter: Secondary | ICD-10-CM | POA: Diagnosis not present

## 2022-08-02 DIAGNOSIS — S46111D Strain of muscle, fascia and tendon of long head of biceps, right arm, subsequent encounter: Secondary | ICD-10-CM | POA: Diagnosis not present

## 2022-08-10 DIAGNOSIS — M7541 Impingement syndrome of right shoulder: Secondary | ICD-10-CM | POA: Diagnosis not present

## 2022-08-10 DIAGNOSIS — S46011D Strain of muscle(s) and tendon(s) of the rotator cuff of right shoulder, subsequent encounter: Secondary | ICD-10-CM | POA: Diagnosis not present

## 2022-08-10 DIAGNOSIS — M25611 Stiffness of right shoulder, not elsewhere classified: Secondary | ICD-10-CM | POA: Diagnosis not present

## 2022-08-10 DIAGNOSIS — S46111D Strain of muscle, fascia and tendon of long head of biceps, right arm, subsequent encounter: Secondary | ICD-10-CM | POA: Diagnosis not present

## 2022-08-10 DIAGNOSIS — M6281 Muscle weakness (generalized): Secondary | ICD-10-CM | POA: Diagnosis not present

## 2022-08-16 DIAGNOSIS — E119 Type 2 diabetes mellitus without complications: Secondary | ICD-10-CM | POA: Diagnosis not present

## 2022-08-16 DIAGNOSIS — H2513 Age-related nuclear cataract, bilateral: Secondary | ICD-10-CM | POA: Diagnosis not present

## 2022-08-16 DIAGNOSIS — Z7984 Long term (current) use of oral hypoglycemic drugs: Secondary | ICD-10-CM | POA: Diagnosis not present

## 2022-08-16 LAB — HM DIABETES EYE EXAM

## 2022-08-17 DIAGNOSIS — M6281 Muscle weakness (generalized): Secondary | ICD-10-CM | POA: Diagnosis not present

## 2022-08-17 DIAGNOSIS — S46011D Strain of muscle(s) and tendon(s) of the rotator cuff of right shoulder, subsequent encounter: Secondary | ICD-10-CM | POA: Diagnosis not present

## 2022-08-17 DIAGNOSIS — S46111D Strain of muscle, fascia and tendon of long head of biceps, right arm, subsequent encounter: Secondary | ICD-10-CM | POA: Diagnosis not present

## 2022-08-17 DIAGNOSIS — M25611 Stiffness of right shoulder, not elsewhere classified: Secondary | ICD-10-CM | POA: Diagnosis not present

## 2022-08-17 DIAGNOSIS — M7541 Impingement syndrome of right shoulder: Secondary | ICD-10-CM | POA: Diagnosis not present

## 2022-08-23 DIAGNOSIS — S46111D Strain of muscle, fascia and tendon of long head of biceps, right arm, subsequent encounter: Secondary | ICD-10-CM | POA: Diagnosis not present

## 2022-08-23 DIAGNOSIS — M6281 Muscle weakness (generalized): Secondary | ICD-10-CM | POA: Diagnosis not present

## 2022-08-23 DIAGNOSIS — S46011D Strain of muscle(s) and tendon(s) of the rotator cuff of right shoulder, subsequent encounter: Secondary | ICD-10-CM | POA: Diagnosis not present

## 2022-08-23 DIAGNOSIS — M25611 Stiffness of right shoulder, not elsewhere classified: Secondary | ICD-10-CM | POA: Diagnosis not present

## 2022-08-23 DIAGNOSIS — M7541 Impingement syndrome of right shoulder: Secondary | ICD-10-CM | POA: Diagnosis not present

## 2022-08-30 DIAGNOSIS — S46011D Strain of muscle(s) and tendon(s) of the rotator cuff of right shoulder, subsequent encounter: Secondary | ICD-10-CM | POA: Diagnosis not present

## 2022-08-30 DIAGNOSIS — S46111D Strain of muscle, fascia and tendon of long head of biceps, right arm, subsequent encounter: Secondary | ICD-10-CM | POA: Diagnosis not present

## 2022-08-30 DIAGNOSIS — M6281 Muscle weakness (generalized): Secondary | ICD-10-CM | POA: Diagnosis not present

## 2022-08-30 DIAGNOSIS — M7541 Impingement syndrome of right shoulder: Secondary | ICD-10-CM | POA: Diagnosis not present

## 2022-08-30 DIAGNOSIS — M25611 Stiffness of right shoulder, not elsewhere classified: Secondary | ICD-10-CM | POA: Diagnosis not present

## 2022-09-06 DIAGNOSIS — M6281 Muscle weakness (generalized): Secondary | ICD-10-CM | POA: Diagnosis not present

## 2022-09-06 DIAGNOSIS — S46111D Strain of muscle, fascia and tendon of long head of biceps, right arm, subsequent encounter: Secondary | ICD-10-CM | POA: Diagnosis not present

## 2022-09-06 DIAGNOSIS — M7541 Impingement syndrome of right shoulder: Secondary | ICD-10-CM | POA: Diagnosis not present

## 2022-09-06 DIAGNOSIS — S46011D Strain of muscle(s) and tendon(s) of the rotator cuff of right shoulder, subsequent encounter: Secondary | ICD-10-CM | POA: Diagnosis not present

## 2022-09-06 DIAGNOSIS — M25611 Stiffness of right shoulder, not elsewhere classified: Secondary | ICD-10-CM | POA: Diagnosis not present

## 2022-09-13 ENCOUNTER — Other Ambulatory Visit: Payer: Self-pay | Admitting: Nurse Practitioner

## 2022-09-13 DIAGNOSIS — M7541 Impingement syndrome of right shoulder: Secondary | ICD-10-CM | POA: Diagnosis not present

## 2022-09-13 DIAGNOSIS — S46011D Strain of muscle(s) and tendon(s) of the rotator cuff of right shoulder, subsequent encounter: Secondary | ICD-10-CM | POA: Diagnosis not present

## 2022-09-13 DIAGNOSIS — S46111D Strain of muscle, fascia and tendon of long head of biceps, right arm, subsequent encounter: Secondary | ICD-10-CM | POA: Diagnosis not present

## 2022-09-13 DIAGNOSIS — E1169 Type 2 diabetes mellitus with other specified complication: Secondary | ICD-10-CM

## 2022-09-13 DIAGNOSIS — M6281 Muscle weakness (generalized): Secondary | ICD-10-CM | POA: Diagnosis not present

## 2022-09-13 DIAGNOSIS — M25611 Stiffness of right shoulder, not elsewhere classified: Secondary | ICD-10-CM | POA: Diagnosis not present

## 2022-09-13 NOTE — Telephone Encounter (Signed)
LOV 06/10/21 NOV Not scheduled LRF 06/10/22 90 tab 0 refill    Patient needs an appointment for further refills

## 2022-09-14 DIAGNOSIS — J069 Acute upper respiratory infection, unspecified: Secondary | ICD-10-CM | POA: Diagnosis not present

## 2022-09-14 DIAGNOSIS — R051 Acute cough: Secondary | ICD-10-CM | POA: Diagnosis not present

## 2022-09-20 DIAGNOSIS — S46011D Strain of muscle(s) and tendon(s) of the rotator cuff of right shoulder, subsequent encounter: Secondary | ICD-10-CM | POA: Diagnosis not present

## 2022-09-20 DIAGNOSIS — M6281 Muscle weakness (generalized): Secondary | ICD-10-CM | POA: Diagnosis not present

## 2022-09-20 DIAGNOSIS — M25611 Stiffness of right shoulder, not elsewhere classified: Secondary | ICD-10-CM | POA: Diagnosis not present

## 2022-09-20 DIAGNOSIS — S46111D Strain of muscle, fascia and tendon of long head of biceps, right arm, subsequent encounter: Secondary | ICD-10-CM | POA: Diagnosis not present

## 2022-09-20 DIAGNOSIS — M7541 Impingement syndrome of right shoulder: Secondary | ICD-10-CM | POA: Diagnosis not present

## 2022-10-03 DIAGNOSIS — H6501 Acute serous otitis media, right ear: Secondary | ICD-10-CM | POA: Diagnosis not present

## 2022-10-03 DIAGNOSIS — H6121 Impacted cerumen, right ear: Secondary | ICD-10-CM | POA: Diagnosis not present

## 2022-10-11 DIAGNOSIS — M7541 Impingement syndrome of right shoulder: Secondary | ICD-10-CM | POA: Diagnosis not present

## 2022-10-11 DIAGNOSIS — M6281 Muscle weakness (generalized): Secondary | ICD-10-CM | POA: Diagnosis not present

## 2022-10-11 DIAGNOSIS — H90A31 Mixed conductive and sensorineural hearing loss, unilateral, right ear with restricted hearing on the contralateral side: Secondary | ICD-10-CM | POA: Diagnosis not present

## 2022-10-11 DIAGNOSIS — H6501 Acute serous otitis media, right ear: Secondary | ICD-10-CM | POA: Diagnosis not present

## 2022-10-11 DIAGNOSIS — M25611 Stiffness of right shoulder, not elsewhere classified: Secondary | ICD-10-CM | POA: Diagnosis not present

## 2022-10-11 DIAGNOSIS — S46111D Strain of muscle, fascia and tendon of long head of biceps, right arm, subsequent encounter: Secondary | ICD-10-CM | POA: Diagnosis not present

## 2022-10-11 DIAGNOSIS — S46011D Strain of muscle(s) and tendon(s) of the rotator cuff of right shoulder, subsequent encounter: Secondary | ICD-10-CM | POA: Diagnosis not present

## 2022-10-19 ENCOUNTER — Other Ambulatory Visit: Payer: Self-pay | Admitting: Nurse Practitioner

## 2022-10-19 DIAGNOSIS — K200 Eosinophilic esophagitis: Secondary | ICD-10-CM

## 2022-10-19 DIAGNOSIS — E1169 Type 2 diabetes mellitus with other specified complication: Secondary | ICD-10-CM

## 2022-10-20 ENCOUNTER — Telehealth: Payer: Self-pay

## 2022-10-20 DIAGNOSIS — E1169 Type 2 diabetes mellitus with other specified complication: Secondary | ICD-10-CM

## 2022-10-20 MED ORDER — METFORMIN HCL 500 MG PO TABS: 500.0000 mg | ORAL_TABLET | Freq: Every day | ORAL | 0 refills | Status: DC

## 2022-10-20 NOTE — Telephone Encounter (Signed)
Pt is requesting a short fill until appointment 11/24/22 metFORMIN (GLUCOPHAGE) 500 MG tablet  omeprazole (PRILOSEC) 40 MG capsule   Pharmacy: [facility] [place] 06/10/21 ROV 11/24/22

## 2022-10-20 NOTE — Telephone Encounter (Signed)
30 day sent 

## 2022-11-01 DIAGNOSIS — M25611 Stiffness of right shoulder, not elsewhere classified: Secondary | ICD-10-CM | POA: Diagnosis not present

## 2022-11-01 DIAGNOSIS — M6281 Muscle weakness (generalized): Secondary | ICD-10-CM | POA: Diagnosis not present

## 2022-11-01 DIAGNOSIS — S46011D Strain of muscle(s) and tendon(s) of the rotator cuff of right shoulder, subsequent encounter: Secondary | ICD-10-CM | POA: Diagnosis not present

## 2022-11-01 DIAGNOSIS — H903 Sensorineural hearing loss, bilateral: Secondary | ICD-10-CM | POA: Diagnosis not present

## 2022-11-01 DIAGNOSIS — M7541 Impingement syndrome of right shoulder: Secondary | ICD-10-CM | POA: Diagnosis not present

## 2022-11-01 DIAGNOSIS — H6501 Acute serous otitis media, right ear: Secondary | ICD-10-CM | POA: Diagnosis not present

## 2022-11-01 DIAGNOSIS — S46111D Strain of muscle, fascia and tendon of long head of biceps, right arm, subsequent encounter: Secondary | ICD-10-CM | POA: Diagnosis not present

## 2022-11-23 ENCOUNTER — Encounter: Payer: Self-pay | Admitting: Pharmacist

## 2022-11-23 NOTE — Progress Notes (Signed)
[facility] Team Statin Quality Measure Assessment   11/23/2022  MCCABE MARKOSKI [birth year] [id number]  Per review of chart and payor information, Mr. Disantis has a diagnosis of diabetes but is not currently filling a statin prescription.  This places patient into the Statin Use In Patients with Diabetes (SUPD) measure for CMS.    I could not find any documentation of previous trial of a statin or a history of statin intolerance.  Please consider evaluating Mr. Gantert for statin therapy at his office visit tomorrow.  His 10-year ASCVD risk score (Arnett DK, et al., 2019) is: 39.9%.  Please consider ONE of the following recommendations:  Initiate moderate intensity statin Atorvastatin 10 mg once daily, #90, 3 refills   Rosuvastatin 5 mg once daily, #90, 3 refills    Initiate low intensity          statin with reduced frequency if prior          statin intolerance 1x weekly, #13, 3 refills   2x weekly, #26, 3 refills   3x weekly, #39, 3 refills    Code for past statin intolerance or  other exclusions (required annually)  Provider Requirements: Associate code during an office visit or telehealth encounter  Drug Induced Myopathy G72.0   Myopathy, unspecified G72.9   Myositis, unspecified M60.9   Rhabdomyolysis M62.82   Cirrhosis of liver K74.69   Prediabetes R73.03   PCOS E28.2   Thank you for allowing [facility] pharmacy to be a part of this patient's care.  Curlene Labrum, PharmD [place] Pharmacist Office: [phone]

## 2022-11-24 ENCOUNTER — Encounter: Payer: Self-pay | Admitting: Family Medicine

## 2022-11-24 ENCOUNTER — Ambulatory Visit (INDEPENDENT_AMBULATORY_CARE_PROVIDER_SITE_OTHER): Payer: Medicare HMO | Admitting: Family Medicine

## 2022-11-24 VITALS — BP 122/79 | HR 66 | Resp 18 | Ht 70.0 in | Wt 186.0 lb

## 2022-11-24 DIAGNOSIS — K200 Eosinophilic esophagitis: Secondary | ICD-10-CM | POA: Diagnosis not present

## 2022-11-24 DIAGNOSIS — E785 Hyperlipidemia, unspecified: Secondary | ICD-10-CM | POA: Diagnosis not present

## 2022-11-24 DIAGNOSIS — E1169 Type 2 diabetes mellitus with other specified complication: Secondary | ICD-10-CM

## 2022-11-24 MED ORDER — METFORMIN HCL 500 MG PO TABS: 500.0000 mg | ORAL_TABLET | Freq: Every day | ORAL | 0 refills | Status: DC

## 2022-11-24 MED ORDER — OMEPRAZOLE 40 MG PO CPDR: 40.0000 mg | DELAYED_RELEASE_CAPSULE | Freq: Every day | ORAL | 0 refills | Status: DC

## 2022-11-24 NOTE — Assessment & Plan Note (Signed)
We discussed that depending on the results of this lipid panel he may need to start statin therapy.  We discussed its mechanism of action, common side effects, and the importance of taking it in the presence of hyperlipidemia and diabetes.  Patient is agreeable to seeing where his cholesterol levels are at and would be open to starting a statin if warranted.  Will continue to monitor.

## 2022-11-24 NOTE — Progress Notes (Signed)
Established Patient Office Visit  Subjective   Patient ID: Alex Clark, male    DOB: [birth year]  Age: 72 y.o. MRN: [id number]  Chief Complaint  Patient presents with   Medical Management of Chronic Issues    Non fasting   Diabetes   Hyperlipidemia   Gastroesophageal Reflux         Diabetes  Hyperlipidemia  Gastroesophageal Reflux   Alex Clark is a 72 y.o. male presenting today for follow up of diabetes, hyperlipidemia Diabetes: denies hypoglycemic events, wounds or sores that are not healing well, increased thirst or urination. Denies vision problems, eye exam last November.  Not checking glucose at home currently.  Taking metformin as prescribed without any side effects.  Hyperlipidemia: He has never taken a statin before.  ROS Negative unless otherwise noted in HPI   Objective:     BP 122/79 (BP Location: Left Arm, Patient Position: Sitting, Cuff Size: Normal)   Pulse 66   Resp 18   Ht 5' 10"$  (1.778 m)   Wt 186 lb (84.4 kg)   SpO2 97%   BMI 26.69 kg/m   Physical Exam Constitutional:      General: He is not in acute distress.    Appearance: Normal appearance.  HENT:     Head: Normocephalic and atraumatic.  Cardiovascular:     Rate and Rhythm: Normal rate and regular rhythm.     Pulses: Normal pulses.     Heart sounds: Normal heart sounds.  Pulmonary:     Effort: Pulmonary effort is normal.     Breath sounds: Normal breath sounds.  Skin:    General: Skin is warm and dry.  Neurological:     Mental Status: He is alert and oriented to person, place, and time.  Psychiatric:        Mood and Affect: Mood normal.   The 10-year ASCVD risk score (Arnett DK, et al., 2019) is: 30.4%   Assessment & Plan:  Hyperlipidemia associated with type 2 diabetes mellitus ([place]) Assessment & Plan: We discussed that depending on the results of this lipid panel he may need to start statin therapy.  We discussed its mechanism of action, common side effects, and the  importance of taking it in the presence of hyperlipidemia and diabetes.  Patient is agreeable to seeing where his cholesterol levels are at and would be open to starting a statin if warranted.  Will continue to monitor.  Orders: -     Lipid panel; Future -     CBC with Differential/Platelet; Future -     Comprehensive metabolic panel; Future  Type 2 diabetes mellitus with other specified complication, without long-term current use of insulin (HCC) Assessment & Plan: Currently stable.  Checking A1c, CBC, CMP today.  Continue metformin 500 mg daily unless labs indicate differently.  Will continue to monitor.  Orders: -     metFORMIN HCl; Take 1 tablet (500 mg total) by mouth daily with breakfast.  Dispense: 90 tablet; Refill: 0 -     Hemoglobin A1c; Future -     CBC with Differential/Platelet; Future -     Comprehensive metabolic panel; Future  Eosinophilic esophagitis Assessment & Plan: Stable.  Continue omeprazole.  Will continue to monitor.  Orders: -     Omeprazole; Take 1 capsule (40 mg total) by mouth daily before breakfast.  Dispense: 90 capsule; Refill: 0   Return in about 3 months (around 02/22/2023) for Annual Medicare Wellness Visit.    Darel Hong  [place], [place]

## 2022-11-24 NOTE — Assessment & Plan Note (Signed)
Currently stable.  Checking A1c, CBC, CMP today.  Continue metformin 500 mg daily unless labs indicate differently.  Will continue to monitor.

## 2022-11-24 NOTE — Assessment & Plan Note (Signed)
Stable.  Continue omeprazole.  Will continue to monitor.

## 2022-12-28 ENCOUNTER — Other Ambulatory Visit: Payer: Medicare HMO

## 2022-12-28 DIAGNOSIS — E1169 Type 2 diabetes mellitus with other specified complication: Secondary | ICD-10-CM | POA: Diagnosis not present

## 2022-12-28 DIAGNOSIS — E785 Hyperlipidemia, unspecified: Secondary | ICD-10-CM | POA: Diagnosis not present

## 2022-12-29 ENCOUNTER — Other Ambulatory Visit: Payer: Self-pay | Admitting: Family Medicine

## 2022-12-29 DIAGNOSIS — E1169 Type 2 diabetes mellitus with other specified complication: Secondary | ICD-10-CM

## 2022-12-29 LAB — CBC WITH DIFFERENTIAL/PLATELET
Basophils Absolute: 0 10*3/uL (ref 0.0–0.2)
Basos: 1 %
EOS (ABSOLUTE): 0.4 10*3/uL (ref 0.0–0.4)
Eos: 6 %
Hematocrit: 41.6 % (ref 37.5–51.0)
Hemoglobin: 13.7 g/dL (ref 13.0–17.7)
Immature Grans (Abs): 0 10*3/uL (ref 0.0–0.1)
Immature Granulocytes: 0 %
Lymphocytes Absolute: 1.7 10*3/uL (ref 0.7–3.1)
Lymphs: 31 %
MCH: 28.8 pg (ref 26.6–33.0)
MCHC: 32.9 g/dL (ref 31.5–35.7)
MCV: 88 fL (ref 79–97)
Monocytes Absolute: 0.6 10*3/uL (ref 0.1–0.9)
Monocytes: 11 %
Neutrophils Absolute: 2.9 10*3/uL (ref 1.4–7.0)
Neutrophils: 51 %
Platelets: 188 10*3/uL (ref 150–450)
RBC: 4.75 x10E6/uL (ref 4.14–5.80)
RDW: 14 % (ref 11.6–15.4)
WBC: 5.6 10*3/uL (ref 3.4–10.8)

## 2022-12-29 LAB — HEMOGLOBIN A1C
Est. average glucose Bld gHb Est-mCnc: 143 mg/dL
Hgb A1c MFr Bld: 6.6 % — ABNORMAL HIGH (ref 4.8–5.6)

## 2022-12-29 LAB — COMPREHENSIVE METABOLIC PANEL
ALT: 16 IU/L (ref 0–44)
AST: 20 IU/L (ref 0–40)
Albumin/Globulin Ratio: 1.9 (ref 1.2–2.2)
Albumin: 4.2 g/dL (ref 3.9–4.9)
Alkaline Phosphatase: 51 IU/L (ref 44–121)
BUN/Creatinine Ratio: 12 (ref 10–24)
BUN: 10 mg/dL (ref 8–27)
Bilirubin Total: 0.6 mg/dL (ref 0.0–1.2)
CO2: 22 mmol/L (ref 20–29)
Calcium: 9 mg/dL (ref 8.6–10.2)
Chloride: 104 mmol/L (ref 96–106)
Creatinine, Ser: 0.86 mg/dL (ref 0.76–1.27)
Globulin, Total: 2.2 g/dL (ref 1.5–4.5)
Glucose: 120 mg/dL — ABNORMAL HIGH (ref 70–99)
Potassium: 4.3 mmol/L (ref 3.5–5.2)
Sodium: 140 mmol/L (ref 134–144)
Total Protein: 6.4 g/dL (ref 6.0–8.5)
eGFR: 94 mL/min/{1.73_m2} (ref >59)

## 2022-12-29 LAB — LIPID PANEL
Chol/HDL Ratio: 4.6 ratio (ref 0.0–5.0)
Cholesterol, Total: 173 mg/dL (ref 100–199)
HDL: 38 mg/dL — ABNORMAL LOW (ref >39)
LDL Chol Calc (NIH): 106 mg/dL — ABNORMAL HIGH (ref 0–99)
Triglycerides: 165 mg/dL — ABNORMAL HIGH (ref 0–149)
VLDL Cholesterol Cal: 29 mg/dL (ref 5–40)

## 2022-12-29 MED ORDER — ROSUVASTATIN CALCIUM 5 MG PO TABS: 5.0000 mg | ORAL_TABLET | Freq: Every day | ORAL | 1 refills | Status: DC

## 2023-02-01 ENCOUNTER — Telehealth: Payer: Self-pay

## 2023-02-01 NOTE — Telephone Encounter (Signed)
Contacted Alex Clark to schedule their annual wellness visit. Appointment made for 02/15/23.  Agnes Lawrence, CMA (AAMA)  [facility] [phone]

## 2023-02-02 ENCOUNTER — Other Ambulatory Visit: Payer: Self-pay

## 2023-02-02 DIAGNOSIS — E1169 Type 2 diabetes mellitus with other specified complication: Secondary | ICD-10-CM

## 2023-02-08 ENCOUNTER — Other Ambulatory Visit: Payer: Medicare HMO

## 2023-02-08 DIAGNOSIS — E1169 Type 2 diabetes mellitus with other specified complication: Secondary | ICD-10-CM | POA: Diagnosis not present

## 2023-02-08 DIAGNOSIS — E785 Hyperlipidemia, unspecified: Secondary | ICD-10-CM | POA: Diagnosis not present

## 2023-02-09 LAB — COMPREHENSIVE METABOLIC PANEL
ALT: 20 IU/L (ref 0–44)
AST: 20 IU/L (ref 0–40)
Albumin/Globulin Ratio: 1.8 (ref 1.2–2.2)
Albumin: 4.4 g/dL (ref 3.9–4.9)
Alkaline Phosphatase: 52 IU/L (ref 44–121)
BUN/Creatinine Ratio: 11 (ref 10–24)
BUN: 13 mg/dL (ref 8–27)
Bilirubin Total: 0.6 mg/dL (ref 0.0–1.2)
CO2: 21 mmol/L (ref 20–29)
Calcium: 8.9 mg/dL (ref 8.6–10.2)
Chloride: 103 mmol/L (ref 96–106)
Creatinine, Ser: 1.22 mg/dL (ref 0.76–1.27)
Globulin, Total: 2.4 g/dL (ref 1.5–4.5)
Glucose: 118 mg/dL — ABNORMAL HIGH (ref 70–99)
Potassium: 4.5 mmol/L (ref 3.5–5.2)
Sodium: 140 mmol/L (ref 134–144)
Total Protein: 6.8 g/dL (ref 6.0–8.5)
eGFR: 64 mL/min/{1.73_m2} (ref >59)

## 2023-02-09 LAB — HEMOGLOBIN A1C
Est. average glucose Bld gHb Est-mCnc: 134 mg/dL
Hgb A1c MFr Bld: 6.3 % — ABNORMAL HIGH (ref 4.8–5.6)

## 2023-02-09 LAB — LIPID PANEL
Chol/HDL Ratio: 3.1 ratio (ref 0.0–5.0)
Cholesterol, Total: 128 mg/dL (ref 100–199)
HDL: 41 mg/dL (ref >39)
LDL Chol Calc (NIH): 66 mg/dL (ref 0–99)
Triglycerides: 113 mg/dL (ref 0–149)
VLDL Cholesterol Cal: 21 mg/dL (ref 5–40)

## 2023-02-09 LAB — CBC WITH DIFFERENTIAL/PLATELET
Basophils Absolute: 0.1 10*3/uL (ref 0.0–0.2)
Basos: 1 %
EOS (ABSOLUTE): 0.3 10*3/uL (ref 0.0–0.4)
Eos: 5 %
Hematocrit: 43.6 % (ref 37.5–51.0)
Hemoglobin: 14.6 g/dL (ref 13.0–17.7)
Immature Grans (Abs): 0 10*3/uL (ref 0.0–0.1)
Immature Granulocytes: 0 %
Lymphocytes Absolute: 1.7 10*3/uL (ref 0.7–3.1)
Lymphs: 25 %
MCH: 29.2 pg (ref 26.6–33.0)
MCHC: 33.5 g/dL (ref 31.5–35.7)
MCV: 87 fL (ref 79–97)
Monocytes Absolute: 0.6 10*3/uL (ref 0.1–0.9)
Monocytes: 9 %
Neutrophils Absolute: 4.3 10*3/uL (ref 1.4–7.0)
Neutrophils: 60 %
Platelets: 208 10*3/uL (ref 150–450)
RBC: 5 x10E6/uL (ref 4.14–5.80)
RDW: 12.9 % (ref 11.6–15.4)
WBC: 7 10*3/uL (ref 3.4–10.8)

## 2023-02-15 ENCOUNTER — Ambulatory Visit: Payer: Medicare HMO

## 2023-02-21 ENCOUNTER — Other Ambulatory Visit: Payer: Self-pay | Admitting: Family Medicine

## 2023-02-21 DIAGNOSIS — K200 Eosinophilic esophagitis: Secondary | ICD-10-CM

## 2023-02-21 DIAGNOSIS — E1169 Type 2 diabetes mellitus with other specified complication: Secondary | ICD-10-CM

## 2023-02-22 ENCOUNTER — Ambulatory Visit: Payer: Medicare HMO

## 2023-02-24 ENCOUNTER — Ambulatory Visit (INDEPENDENT_AMBULATORY_CARE_PROVIDER_SITE_OTHER): Payer: Medicare HMO

## 2023-02-24 VITALS — Ht 70.0 in | Wt 186.0 lb

## 2023-02-24 DIAGNOSIS — Z0000 Encounter for general adult medical examination without abnormal findings: Secondary | ICD-10-CM

## 2023-02-24 NOTE — Progress Notes (Signed)
Subjective:   Alex Clark is a 72 y.o. male who presents for Medicare Annual/Subsequent preventive examination.  Review of Systems    Virtual Visit via Telephone Note  I connected with  Alex Clark on 02/24/23 at 10:30 AM EDT by telephone and verified that I am speaking with the correct person using two identifiers.  Location: Patient: Home Provider: Office Persons participating in the virtual visit: patient/Nurse Health Advisor   I discussed the limitations, risks, security and privacy concerns of performing an evaluation and management service by telephone and the availability of in person appointments. The patient expressed understanding and agreed to proceed.  Interactive audio and video telecommunications were attempted between this nurse and patient, however failed, due to patient having technical difficulties OR patient did not have access to video capability.  We continued and completed visit with audio only.  Some vital signs may be absent or patient reported.   Tillie Rung, LPN  Cardiac Risk Factors include: advanced age (>50men, >46 women);male gender     Objective:    Today's Vitals   02/24/23 1036  Weight: 186 lb (84.4 kg)  Height: 5\' 10"  (1.778 m)   Body mass index is 26.69 kg/m.     02/24/2023   10:43 AM 10/09/2019   10:18 AM 07/30/2014    9:02 AM  Advanced Directives  Does Patient Have a Medical Advance Directive? No No No  Would patient like information on creating a medical advance directive? No - Patient declined No - Patient declined     Current Medications (verified) Outpatient Encounter Medications as of 02/24/2023  Medication Sig   metFORMIN (GLUCOPHAGE) 500 MG tablet TAKE 1 TABLET BY MOUTH DAILY WITH BREAKFAST.   omeprazole (PRILOSEC) 40 MG capsule TAKE 1 CAPSULE BY MOUTH DAILY BEFORE BREAKFAST.   rosuvastatin (CRESTOR) 5 MG tablet Take 1 tablet (5 mg total) by mouth daily.   [DISCONTINUED] aspirin EC 81 MG tablet Take 1 tablet (81  mg total) by mouth 2 (two) times daily. For DVT prophylaxis for 30 days after surgery.   No facility-administered encounter medications on file as of 02/24/2023.    Allergies (verified) Pantoprazole sodium   History: Past Medical History:  Diagnosis Date   Diabetes mellitus without complication (HCC)    diet controlled   Diverticulosis    Eosinophilic esophagitis    GERD (gastroesophageal reflux disease)    Past Surgical History:  Procedure Laterality Date   COLONOSCOPY     ESOPHAGOGASTRODUODENOSCOPY     JOINT REPLACEMENT N/A    [facility] 12/13/2020   KNEE ARTHROSCOPY Left 2012   TOTAL HIP ARTHROPLASTY Left 10/16/2019   Procedure: TOTAL HIP ARTHROPLASTY ANTERIOR APPROACH;  Surgeon: Sheral Apley, MD;  Location: [facility];  Service: Orthopedics;  Laterality: Left;   Family History  Problem Relation Age of Onset   Heart disease Mother    Hypertension Mother    Hypertension Father    Cancer Sister    Cancer Maternal Uncle    Colon cancer Neg Hx    Social History   Socioeconomic History   Marital status: Married    Spouse name: Not on file   Number of children: Not on file   Years of education: Not on file   Highest education level: Not on file  Occupational History   Not on file  Tobacco Use   Smoking status: Never    Passive exposure: Never   Smokeless tobacco: Never  Vaping Use   Vaping Use: Never used  Substance and Sexual Activity   Alcohol use: No    Alcohol/week: 0.0 standard drinks of alcohol   Drug use: No   Sexual activity: Yes  Other Topics Concern   Not on file  Social History Narrative   Academic librarian   Divorced- two children- son lives in [place], [place] an daughter lives in [place]- she has two sons.      Does not have a living will or HPOA- does not want life support if futile.   Social Determinants of Health   Financial Resource Strain: Low Risk  (02/24/2023)   Overall Financial Resource Strain (CARDIA)    Difficulty of Paying Living Expenses: Not  hard at all  Food Insecurity: No Food Insecurity (02/24/2023)   Hunger Vital Sign    Worried About Running Out of Food in the Last Year: Never true    Ran Out of Food in the Last Year: Never true  Transportation Needs: No Transportation Needs (02/24/2023)   PRAPARE - Administrator, Civil Service (Medical): No    Lack of Transportation (Non-Medical): No  Physical Activity: Sufficiently Active (02/24/2023)   Exercise Vital Sign    Days of Exercise per Week: 5 days    Minutes of Exercise per Session: 30 min  Stress: No Stress Concern Present (02/24/2023)   [employer] of Occupational Health - Occupational Stress Questionnaire    Feeling of Stress : Not at all  Social Connections: Socially Integrated (02/24/2023)   Social Connection and Isolation Panel [NHANES]    Frequency of Communication with Friends and Family: More than three times a week    Frequency of Social Gatherings with Friends and Family: More than three times a week    Attends Religious Services: More than 4 times per year    Active Member of [employer] or Organizations: Yes    Attends Engineer, structural: More than 4 times per year    Marital Status: Married    Tobacco Counseling Counseling given: Not Answered   Clinical Intake:  Pre-visit preparation completed: Yes  Pain : No/denies pain     BMI - recorded: 26.69 Nutritional Status: BMI 25 -29 Overweight Nutritional Risks: None Diabetes: No  How often do you need to have someone help you when you read instructions, pamphlets, or other written materials from your doctor or pharmacy?: 1 - Never  Diabetic? Pre Diabetic  Interpreter Needed?: No  Information entered by :: Francis Gaines LPN   Activities of Daily Living    02/24/2023   10:42 AM 02/21/2023    1:26 PM  In your present state of health, do you have any difficulty performing the following activities:  Hearing? 0 0  Vision? 0 0  Difficulty concentrating or making decisions? 0  0  Walking or climbing stairs? 0 0  Dressing or bathing? 0 0  Doing errands, shopping? 0 0  Preparing Food and eating ? N N  Using the Toilet? N N  In the past six months, have you accidently leaked urine? N N  Do you have problems with loss of bowel control? N N  Managing your Medications? N N  Managing your Finances? N N  Housekeeping or managing your Housekeeping? N N    Patient Care Team: Melida Quitter, PA as PCP - General (Family Medicine) Iva Boop, MD as Consulting Physician (Gastroenterology)  Indicate any recent Medical Services you may have received from other than [facility] providers in the past year (date may be approximate).  Assessment:   This is a routine wellness examination for Alex Clark.  Hearing/Vision screen Hearing Screening - Comments:: Denies hearing difficulties   Vision Screening - Comments:: Wears reading glasses - up to date with routine eye exams with  Dr Sharia Reeve  Dietary issues and exercise activities discussed: Current Exercise Habits: Home exercise routine, Time (Minutes): 30, Frequency (Times/Week): 5, Weekly Exercise (Minutes/Week): 150, Intensity: Moderate, Exercise limited by: None identified   Goals Addressed               This Visit's Progress     Stay Healthy (pt-stated)         Depression Screen    02/24/2023   10:41 AM 11/24/2022    1:56 PM 06/10/2021   11:12 AM 02/25/2021   10:25 AM 12/16/2020   10:12 AM 05/21/2019   11:16 AM 12/13/2017    9:25 AM  PHQ 2/9 Scores  PHQ - 2 Score 0 0 0 0 0 0 0  PHQ- 9 Score   0 0 0      Fall Risk    02/24/2023   10:42 AM 02/21/2023    1:26 PM 11/24/2022    1:58 PM 06/10/2021   11:12 AM 02/25/2021   10:24 AM  Fall Risk   Falls in the past year? 0 0 0 0 0  Number falls in past yr: 0 0 0 0 0  Injury with Fall? 0 0 0 0 0  Risk for fall due to : No Fall Risks   No Fall Risks No Fall Risks  Follow up Falls prevention discussed   Falls evaluation completed Falls evaluation completed    FALL  RISK PREVENTION PERTAINING TO THE HOME:  Any stairs in or around the home? Yes  If so, are there any without handrails? No  Home free of loose throw rugs in walkways, pet beds, electrical cords, etc? Yes  Adequate lighting in your home to reduce risk of falls? Yes   ASSISTIVE DEVICES UTILIZED TO PREVENT FALLS:  Life alert? No  Use of a cane, walker or w/c? No  Grab bars in the bathroom? No  Shower chair or bench in shower? No  Elevated toilet seat or a handicapped toilet? No   TIMED UP AND GO:  Was the test performed? No . Audio Visit  Cognitive Function:        02/24/2023   10:43 AM  6CIT Screen  What Year? 0 points  What month? 0 points  What time? 0 points  Count back from 20 0 points  Months in reverse 0 points  Repeat phrase 0 points  Total Score 0 points    Immunizations Immunization History  Administered Date(s) Administered   Influenza, High Dose Seasonal PF 08/23/2019   Influenza, Quadrivalent, Recombinant, Inj, Pf 07/18/2017   Influenza-Unspecified 07/18/2014, 08/23/2019   PFIZER(Purple Top)SARS-COV-2 Vaccination 10/24/2019, 11/14/2019   Pneumococcal Conjugate-13 05/21/2019   Tdap 11/01/2017   Zoster Recombinat (Shingrix) 08/26/2021   Zoster, Live 04/05/2016    TDAP status: Up to date  Flu Vaccine status: Up to date  Pneumococcal vaccine status: Due, Education has been provided regarding the importance of this vaccine. Advised may receive this vaccine at local pharmacy or Health Dept. Aware to provide a copy of the vaccination record if obtained from local pharmacy or Health Dept. Verbalized acceptance and understanding.  Covid-19 vaccine status: Completed vaccines  Qualifies for Shingles Vaccine? Yes   Zostavax completed Yes   Shingrix Completed?: Yes  Screening Tests Health Maintenance  Topic  Date Due   FOOT EXAM  02/25/2022   Diabetic kidney evaluation - Urine ACR  02/25/2023 (Originally 02/25/2022)   COVID-19 Vaccine (3 - Pfizer risk  series) 03/12/2023 (Originally 12/12/2019)   Zoster Vaccines- Shingrix (2 of 2) 05/27/2023 (Originally 10/21/2021)   Pneumonia Vaccine 37+ Years old (2 of 2 - PPSV23 or PCV20) 02/24/2024 (Originally 05/20/2020)   INFLUENZA VACCINE  05/05/2023   HEMOGLOBIN A1C  08/11/2023   OPHTHALMOLOGY EXAM  09/03/2023   Diabetic kidney evaluation - eGFR measurement  02/08/2024   Medicare Annual Wellness (AWV)  02/24/2024   COLONOSCOPY (Pts 45-49yrs Insurance coverage will need to be confirmed)  09/04/2024   DTaP/Tdap/Td (2 - Td or Tdap) 11/02/2027   Hepatitis C Screening  Completed   HPV VACCINES  Aged Out    Health Maintenance  Health Maintenance Due  Topic Date Due   FOOT EXAM  02/25/2022    Colorectal cancer screening: Type of screening: Colonoscopy. Completed 09/04/14. Repeat every 10 years  Lung Cancer Screening: (Low Dose CT Chest recommended if Age 1-80 years, 30 pack-year currently smoking OR have quit w/in 15years.) does not qualify.    Additional Screening:  Hepatitis C Screening: does qualify; Completed 04/05/16  Vision Screening: Recommended annual ophthalmology exams for early detection of glaucoma and other disorders of the eye. Is the patient up to date with their annual eye exam?  Yes  Who is the provider or what is the name of the office in which the patient attends annual eye exams? Dr Sharia Reeve If pt is not established with a provider, would they like to be referred to a provider to establish care? No .   Dental Screening: Recommended annual dental exams for proper oral hygiene  Community Resource Referral / Chronic Care Management:  CRR required this visit?  No   CCM required this visit?  No      Plan:     I have personally reviewed and noted the following in the patient's chart:   Medical and social history Use of alcohol, tobacco or illicit drugs  Current medications and supplements including opioid prescriptions. Patient is not currently taking opioid  prescriptions. Functional ability and status Nutritional status Physical activity Advanced directives List of other physicians Hospitalizations, surgeries, and ER visits in previous 12 months Vitals Screenings to include cognitive, depression, and falls Referrals and appointments  In addition, I have reviewed and discussed with patient certain preventive protocols, quality metrics, and best practice recommendations. A written personalized care plan for preventive services as well as general preventive health recommendations were provided to patient.     Tillie Rung, LPN   1/61/0960   Nurse Notes: Patient due Diabetic kidney evaluation- Urine ACR

## 2023-02-24 NOTE — Patient Instructions (Addendum)
Alex Clark , Thank you for taking time to come for your Medicare Wellness Visit. I appreciate your ongoing commitment to your health goals. Please review the following plan we discussed and let me know if I can assist you in the future.   These are the goals we discussed:  Goals       Stay Healthy (pt-stated)        This is a list of the screening recommended for you and due dates:  Health Maintenance  Topic Date Due   Complete foot exam   02/25/2022   Yearly kidney health urinalysis for diabetes  02/25/2023*   COVID-19 Vaccine (3 - Pfizer risk series) 03/12/2023*   Zoster (Shingles) Vaccine (2 of 2) 05/27/2023*   Pneumonia Vaccine (2 of 2 - PPSV23 or PCV20) 02/24/2024*   Flu Shot  05/05/2023   Hemoglobin A1C  08/11/2023   Eye exam for diabetics  09/03/2023   Yearly kidney function blood test for diabetes  02/08/2024   Medicare Annual Wellness Visit  02/24/2024   Colon Cancer Screening  09/04/2024   DTaP/Tdap/Td vaccine (2 - Td or Tdap) 11/02/2027   Hepatitis C Screening: USPSTF Recommendation to screen - Ages 70-70 yo.  Completed   HPV Vaccine  Aged Out  *Topic was postponed. The date shown is not the original due date.    Advanced directives: Advance directive discussed with you today. Even though you declined this today, please call our office should you change your mind, and we can give you the proper paperwork for you to fill out.   Conditions/risks identified: None  Next appointment: Follow up in one year for your annual wellness visit.    Preventive Care 70 Years and Older, Male  Preventive care refers to lifestyle choices and visits with your health care provider that can promote health and wellness. What does preventive care include? A yearly physical exam. This is also called an annual well check. Dental exams once or twice a year. Routine eye exams. Ask your health care provider how often you should have your eyes checked. Personal lifestyle choices,  including: Daily care of your teeth and gums. Regular physical activity. Eating a healthy diet. Avoiding tobacco and drug use. Limiting alcohol use. Practicing safe sex. Taking low doses of aspirin every day. Taking vitamin and mineral supplements as recommended by your health care provider. What happens during an annual well check? The services and screenings done by your health care provider during your annual well check will depend on your age, overall health, lifestyle risk factors, and family history of disease. Counseling  Your health care provider may ask you questions about your: Alcohol use. Tobacco use. Drug use. Emotional well-being. Home and relationship well-being. Sexual activity. Eating habits. History of falls. Memory and ability to understand (cognition). Work and work Astronomer. Screening  You may have the following tests or measurements: Height, weight, and BMI. Blood pressure. Lipid and cholesterol levels. These may be checked every 5 years, or more frequently if you are over 72 years old. Skin check. Lung cancer screening. You may have this screening every year starting at age 70 if you have a 30-pack-year history of smoking and currently smoke or have quit within the past 15 years. Fecal occult blood test (FOBT) of the stool. You may have this test every year starting at age 70 Flexible sigmoidoscopy or colonoscopy. You may have a sigmoidoscopy every 5 years or a colonoscopy every 10 years starting at age 70 Prostate cancer screening. Recommendations  will vary depending on your family history and other risks. Hepatitis C blood test. Hepatitis B blood test. Sexually transmitted disease (STD) testing. Diabetes screening. This is done by checking your blood sugar (glucose) after you have not eaten for a while (fasting). You may have this done every 1-3 years. Abdominal aortic aneurysm (AAA) screening. You may need this if you are a current or former  smoker. Osteoporosis. You may be screened starting at age 70 if you are at high risk. Talk with your health care provider about your test results, treatment options, and if necessary, the need for more tests. Vaccines  Your health care provider may recommend certain vaccines, such as: Influenza vaccine. This is recommended every year. Tetanus, diphtheria, and acellular pertussis (Tdap, Td) vaccine. You may need a Td booster every 10 years. Zoster vaccine. You may need this after age 70. Pneumococcal 13-valent conjugate (PCV13) vaccine. One dose is recommended after age 70. Pneumococcal polysaccharide (PPSV23) vaccine. One dose is recommended after age 10. Talk to your health care provider about which screenings and vaccines you need and how often you need them. This information is not intended to replace advice given to you by your health care provider. Make sure you discuss any questions you have with your health care provider. Document Released: 10/17/2015 Document Revised: 06/09/2016 Document Reviewed: 07/22/2015 Elsevier Interactive Patient Education  2017 [employer].  Fall Prevention in the Home Falls can cause injuries. They can happen to people of all ages. There are many things you can do to make your home safe and to help prevent falls. What can I do on the outside of my home? Regularly fix the edges of walkways and driveways and fix any cracks. Remove anything that might make you trip as you walk through a door, such as a raised step or threshold. Trim any bushes or trees on the path to your home. Use bright outdoor lighting. Clear any walking paths of anything that might make someone trip, such as rocks or tools. Regularly check to see if handrails are loose or broken. Make sure that both sides of any steps have handrails. Any raised decks and porches should have guardrails on the edges. Have any leaves, snow, or ice cleared regularly. Use sand or salt on walking paths during  winter. Clean up any spills in your garage right away. This includes oil or grease spills. What can I do in the bathroom? Use night lights. Install grab bars by the toilet and in the tub and shower. Do not use towel bars as grab bars. Use non-skid mats or decals in the tub or shower. If you need to sit down in the shower, use a plastic, non-slip stool. Keep the floor dry. Clean up any water that spills on the floor as soon as it happens. Remove soap buildup in the tub or shower regularly. Attach bath mats securely with double-sided non-slip rug tape. Do not have throw rugs and other things on the floor that can make you trip. What can I do in the bedroom? Use night lights. Make sure that you have a light by your bed that is easy to reach. Do not use any sheets or blankets that are too big for your bed. They should not hang down onto the floor. Have a firm chair that has side arms. You can use this for support while you get dressed. Do not have throw rugs and other things on the floor that can make you trip. What can I do  in the kitchen? Clean up any spills right away. Avoid walking on wet floors. Keep items that you use a lot in easy-to-reach places. If you need to reach something above you, use a strong step stool that has a grab bar. Keep electrical cords out of the way. Do not use floor polish or wax that makes floors slippery. If you must use wax, use non-skid floor wax. Do not have throw rugs and other things on the floor that can make you trip. What can I do with my stairs? Do not leave any items on the stairs. Make sure that there are handrails on both sides of the stairs and use them. Fix handrails that are broken or loose. Make sure that handrails are as long as the stairways. Check any carpeting to make sure that it is firmly attached to the stairs. Fix any carpet that is loose or worn. Avoid having throw rugs at the top or bottom of the stairs. If you do have throw rugs,  attach them to the floor with carpet tape. Make sure that you have a light switch at the top of the stairs and the bottom of the stairs. If you do not have them, ask someone to add them for you. What else can I do to help prevent falls? Wear shoes that: Do not have high heels. Have rubber bottoms. Are comfortable and fit you well. Are closed at the toe. Do not wear sandals. If you use a stepladder: Make sure that it is fully opened. Do not climb a closed stepladder. Make sure that both sides of the stepladder are locked into place. Ask someone to hold it for you, if possible. Clearly mark and make sure that you can see: Any grab bars or handrails. First and last steps. Where the edge of each step is. Use tools that help you move around (mobility aids) if they are needed. These include: Canes. Walkers. Scooters. Crutches. Turn on the lights when you go into a dark area. Replace any light bulbs as soon as they burn out. Set up your furniture so you have a clear path. Avoid moving your furniture around. If any of your floors are uneven, fix them. If there are any pets around you, be aware of where they are. Review your medicines with your doctor. Some medicines can make you feel dizzy. This can increase your chance of falling. Ask your doctor what other things that you can do to help prevent falls. This information is not intended to replace advice given to you by your health care provider. Make sure you discuss any questions you have with your health care provider. Document Released: 07/17/2009 Document Revised: 02/26/2016 Document Reviewed: 10/25/2014 Elsevier Interactive Patient Education  2017 [employer].

## 2023-03-29 ENCOUNTER — Ambulatory Visit (INDEPENDENT_AMBULATORY_CARE_PROVIDER_SITE_OTHER): Payer: Medicare HMO | Admitting: Family Medicine

## 2023-03-29 ENCOUNTER — Encounter: Payer: Self-pay | Admitting: Family Medicine

## 2023-03-29 VITALS — BP 135/79 | HR 56 | Resp 18 | Ht 70.0 in | Wt 186.0 lb

## 2023-03-29 DIAGNOSIS — E785 Hyperlipidemia, unspecified: Secondary | ICD-10-CM | POA: Diagnosis not present

## 2023-03-29 DIAGNOSIS — E1169 Type 2 diabetes mellitus with other specified complication: Secondary | ICD-10-CM

## 2023-03-29 DIAGNOSIS — K200 Eosinophilic esophagitis: Secondary | ICD-10-CM | POA: Diagnosis not present

## 2023-03-29 DIAGNOSIS — Z7984 Long term (current) use of oral hypoglycemic drugs: Secondary | ICD-10-CM | POA: Diagnosis not present

## 2023-03-29 LAB — POCT UA - MICROALBUMIN
Albumin/Creatinine Ratio, Urine, POC: <30
Creatinine, POC: 100 mg/dL
Microalbumin Ur, POC: 10 mg/L

## 2023-03-29 MED ORDER — ROSUVASTATIN CALCIUM 5 MG PO TABS
5.0000 mg | ORAL_TABLET | Freq: Every day | ORAL | 1 refills | Status: DC
Start: 2023-03-29 — End: 2023-08-01

## 2023-03-29 MED ORDER — OMEPRAZOLE 40 MG PO CPDR
DELAYED_RELEASE_CAPSULE | ORAL | 0 refills | Status: DC
Start: 2023-03-29 — End: 2023-05-23

## 2023-03-29 MED ORDER — METFORMIN HCL 500 MG PO TABS
500.0000 mg | ORAL_TABLET | Freq: Every day | ORAL | 1 refills | Status: DC
Start: 2023-03-29 — End: 2023-05-23

## 2023-03-29 NOTE — Assessment & Plan Note (Signed)
Stable.  Continue omeprazole 40 mg daily.  Will continue to monitor.

## 2023-03-29 NOTE — Assessment & Plan Note (Signed)
Last A1c 6.3, at goal.  Continue metformin 500 mg daily.  Will continue to monitor. Albumin/creatinine ratio within normal limits today.  Foot exam performed.

## 2023-03-29 NOTE — Assessment & Plan Note (Signed)
Started rosuvastatin 5 mg daily and is tolerating well, LDL improved from 106 to 66.  Continue rosuvastatin 5 mg daily.  Will continue to monitor.

## 2023-03-29 NOTE — Progress Notes (Signed)
Established Patient Office Visit  Subjective   Patient ID: Alex Clark, male    DOB: 19-Sep-1951  Age: 72 y.o. MRN: [id number]  Chief Complaint  Patient presents with   Diabetes   Hyperlipidemia    HPI Alex Clark is a 73 y.o. male presenting today for follow up of hyperlipidemia, diabetes. Hyperlipidemia: tolerating rosuvastatin well with no myalgias or significant side effects. Currently consuming a low fat diet.  He has been very active working outside recently. The ASCVD Risk score (Arnett DK, et al., 2019) failed to calculate for the following reasons:   The valid total cholesterol range is 130 to 320 mg/dL Diabetes: denies hypoglycemic events, wounds or sores that are not healing well, increased thirst or urination. Denies vision problems, eye exam completed 09/02/2022. Taking metformin as prescribed without any side effects.   ROS Negative unless otherwise noted in HPI   Objective:     BP (!) 143/79 (BP Location: Left Arm, Patient Position: Sitting, Cuff Size: Normal)   Pulse (!) 56   Resp 18   Ht 5\' 10"  (1.778 m)   Wt 186 lb (84.4 kg)   SpO2 98%   BMI 26.69 kg/m   Physical Exam Constitutional:      General: He is not in acute distress.    Appearance: Normal appearance.  HENT:     Head: Normocephalic and atraumatic.  Cardiovascular:     Rate and Rhythm: Normal rate and regular rhythm.     Pulses: Normal pulses.     Heart sounds: Normal heart sounds. No murmur heard.    No friction rub. No gallop.  Pulmonary:     Effort: Pulmonary effort is normal. No respiratory distress.     Breath sounds: Normal breath sounds. No wheezing, rhonchi or rales.  Skin:    General: Skin is warm and dry.  Neurological:     Mental Status: He is alert and oriented to person, place, and time.  Psychiatric:        Mood and Affect: Mood normal.    Diabetic Foot Exam - Simple   Simple Foot Form Diabetic Foot exam was performed with the following findings: Yes 03/29/2023  10:28 AM  Visual Inspection No deformities, no ulcerations, no other skin breakdown bilaterally: Yes Sensation Testing Intact to touch and monofilament testing bilaterally: Yes Pulse Check Posterior Tibialis and Dorsalis pulse intact bilaterally: Yes Comments     Results for orders placed or performed in visit on 03/29/23  POCT UA - Microalbumin  Result Value Ref Range   Microalbumin Ur, POC 10 mg/L   Creatinine, POC 100 mg/dL   Albumin/Creatinine Ratio, Urine, POC <30      Assessment & Plan:  Type 2 diabetes mellitus with other specified complication, without long-term current use of insulin (HCC) Assessment & Plan: Last A1c 6.3, at goal.  Continue metformin 500 mg daily.  Will continue to monitor. Albumin/creatinine ratio within normal limits today.  Foot exam performed.  Orders: -     POCT UA - Microalbumin -     metFORMIN HCl; Take 1 tablet (500 mg total) by mouth daily with breakfast.  Dispense: 90 tablet; Refill: 1  Hyperlipidemia associated with type 2 diabetes mellitus (HCC) Assessment & Plan: Started rosuvastatin 5 mg daily and is tolerating well, LDL improved from 106 to 66.  Continue rosuvastatin 5 mg daily.  Will continue to monitor.  Orders: -     Rosuvastatin Calcium; Take 1 tablet (5 mg total) by mouth daily.  Dispense: 90 tablet; Refill: 1  Eosinophilic esophagitis Assessment & Plan: Stable.  Continue omeprazole 40 mg daily.  Will continue to monitor.  Orders: -     Omeprazole; TAKE 1 CAPSULE BY MOUTH DAILY BEFORE BREAKFAST.  Dispense: 90 capsule; Refill: 0    Return in about 4 months (around 07/29/2023) for follow-up for HLD, DM, fasting blood work 1 week before.    Melida Quitter, PA

## 2023-05-21 ENCOUNTER — Other Ambulatory Visit: Payer: Self-pay | Admitting: Family Medicine

## 2023-05-21 DIAGNOSIS — E1169 Type 2 diabetes mellitus with other specified complication: Secondary | ICD-10-CM

## 2023-05-21 DIAGNOSIS — K200 Eosinophilic esophagitis: Secondary | ICD-10-CM

## 2023-07-21 ENCOUNTER — Other Ambulatory Visit: Payer: Self-pay

## 2023-07-21 DIAGNOSIS — Z8639 Personal history of other endocrine, nutritional and metabolic disease: Secondary | ICD-10-CM

## 2023-07-21 DIAGNOSIS — E1169 Type 2 diabetes mellitus with other specified complication: Secondary | ICD-10-CM

## 2023-07-21 DIAGNOSIS — E119 Type 2 diabetes mellitus without complications: Secondary | ICD-10-CM

## 2023-07-27 ENCOUNTER — Other Ambulatory Visit: Payer: Medicare HMO

## 2023-07-27 DIAGNOSIS — E785 Hyperlipidemia, unspecified: Secondary | ICD-10-CM

## 2023-07-27 DIAGNOSIS — E119 Type 2 diabetes mellitus without complications: Secondary | ICD-10-CM

## 2023-07-27 DIAGNOSIS — Z8639 Personal history of other endocrine, nutritional and metabolic disease: Secondary | ICD-10-CM | POA: Diagnosis not present

## 2023-07-27 DIAGNOSIS — E1169 Type 2 diabetes mellitus with other specified complication: Secondary | ICD-10-CM | POA: Diagnosis not present

## 2023-07-29 LAB — COMPREHENSIVE METABOLIC PANEL
ALT: 17 [IU]/L (ref 0–44)
AST: 19 [IU]/L (ref 0–40)
Albumin: 4.3 g/dL (ref 3.9–4.9)
Alkaline Phosphatase: 56 [IU]/L (ref 44–121)
BUN/Creatinine Ratio: 13 (ref 10–24)
BUN: 14 mg/dL (ref 8–27)
Bilirubin Total: 0.8 mg/dL (ref 0.0–1.2)
CO2: 21 mmol/L (ref 20–29)
Calcium: 9.2 mg/dL (ref 8.6–10.2)
Chloride: 104 mmol/L (ref 96–106)
Creatinine, Ser: 1.06 mg/dL (ref 0.76–1.27)
Globulin, Total: 2.5 g/dL (ref 1.5–4.5)
Glucose: 129 mg/dL — ABNORMAL HIGH (ref 70–99)
Potassium: 4.4 mmol/L (ref 3.5–5.2)
Sodium: 140 mmol/L (ref 134–144)
Total Protein: 6.8 g/dL (ref 6.0–8.5)
eGFR: 76 mL/min/{1.73_m2} (ref >59)

## 2023-07-29 LAB — CBC WITH DIFFERENTIAL/PLATELET
Basophils Absolute: 0 10*3/uL (ref 0.0–0.2)
Basos: 1 %
EOS (ABSOLUTE): 0.5 10*3/uL — ABNORMAL HIGH (ref 0.0–0.4)
Eos: 6 %
Hematocrit: 45.6 % (ref 37.5–51.0)
Hemoglobin: 14.7 g/dL (ref 13.0–17.7)
Immature Grans (Abs): 0 10*3/uL (ref 0.0–0.1)
Immature Granulocytes: 0 %
Lymphocytes Absolute: 2 10*3/uL (ref 0.7–3.1)
Lymphs: 25 %
MCH: 29.2 pg (ref 26.6–33.0)
MCHC: 32.2 g/dL (ref 31.5–35.7)
MCV: 91 fL (ref 79–97)
Monocytes Absolute: 0.7 10*3/uL (ref 0.1–0.9)
Monocytes: 8 %
Neutrophils Absolute: 4.7 10*3/uL (ref 1.4–7.0)
Neutrophils: 60 %
Platelets: 216 10*3/uL (ref 150–450)
RBC: 5.04 x10E6/uL (ref 4.14–5.80)
RDW: 12.6 % (ref 11.6–15.4)
WBC: 7.8 10*3/uL (ref 3.4–10.8)

## 2023-07-29 LAB — LIPID PANEL
Chol/HDL Ratio: 3.5 ratio (ref 0.0–5.0)
Cholesterol, Total: 149 mg/dL (ref 100–199)
HDL: 43 mg/dL (ref >39)
LDL Chol Calc (NIH): 80 mg/dL (ref 0–99)
Triglycerides: 150 mg/dL — ABNORMAL HIGH (ref 0–149)
VLDL Cholesterol Cal: 26 mg/dL (ref 5–40)

## 2023-07-29 LAB — HEMOGLOBIN A1C
Est. average glucose Bld gHb Est-mCnc: 140 mg/dL
Hgb A1c MFr Bld: 6.5 % — ABNORMAL HIGH (ref 4.8–5.6)

## 2023-07-29 LAB — TSH: TSH: 2.34 u[IU]/mL (ref 0.450–4.500)

## 2023-08-01 ENCOUNTER — Encounter: Payer: Self-pay | Admitting: Family Medicine

## 2023-08-01 ENCOUNTER — Ambulatory Visit (INDEPENDENT_AMBULATORY_CARE_PROVIDER_SITE_OTHER): Payer: Medicare HMO | Admitting: Family Medicine

## 2023-08-01 VITALS — BP 117/73 | HR 59 | Resp 18 | Ht 70.0 in | Wt 187.0 lb

## 2023-08-01 DIAGNOSIS — E1169 Type 2 diabetes mellitus with other specified complication: Secondary | ICD-10-CM | POA: Diagnosis not present

## 2023-08-01 DIAGNOSIS — Z23 Encounter for immunization: Secondary | ICD-10-CM

## 2023-08-01 DIAGNOSIS — Z7984 Long term (current) use of oral hypoglycemic drugs: Secondary | ICD-10-CM

## 2023-08-01 DIAGNOSIS — R351 Nocturia: Secondary | ICD-10-CM

## 2023-08-01 DIAGNOSIS — E785 Hyperlipidemia, unspecified: Secondary | ICD-10-CM | POA: Diagnosis not present

## 2023-08-01 MED ORDER — METFORMIN HCL 500 MG PO TABS: 500.0000 mg | ORAL_TABLET | Freq: Every day | ORAL | 1 refills | Status: DC

## 2023-08-01 MED ORDER — ROSUVASTATIN CALCIUM 5 MG PO TABS
5.0000 mg | ORAL_TABLET | Freq: Every day | ORAL | 1 refills | Status: DC
Start: 2023-08-23 — End: 2023-12-06

## 2023-08-01 NOTE — Progress Notes (Signed)
   Established Patient Office Visit  Subjective   Patient ID: Alex Clark, male    DOB: August 22, 1951  Age: 72 y.o. MRN: [id number]  Chief Complaint  Patient presents with   Diabetes   Hyperlipidemia    HPI Alex Clark is a 72 y.o. male presenting today for follow up of hyperlipidemia, diabetes. Hyperlipidemia: tolerating rosuvastatin well with no myalgias or significant side effects.  The 10-year ASCVD risk score (Arnett DK, et al., 2019) is: 24.3% Diabetes: denies hypoglycemic events, wounds or sores that are not healing well, increased thirst or urination. Denies vision problems, eye exam up-to-date. Taking metformin as prescribed without any side effects.    Outpatient Medications Prior to Visit  Medication Sig   omeprazole (PRILOSEC) 40 MG capsule TAKE 1 CAPSULE BY MOUTH DAILY BEFORE BREAKFAST.   [DISCONTINUED] metFORMIN (GLUCOPHAGE) 500 MG tablet TAKE 1 TABLET BY MOUTH DAILY WITH BREAKFAST.   [DISCONTINUED] rosuvastatin (CRESTOR) 5 MG tablet Take 1 tablet (5 mg total) by mouth daily.   No facility-administered medications prior to visit.    ROS Negative unless otherwise noted in HPI   Objective:     BP 117/73 (BP Location: Left Arm, Patient Position: Sitting, Cuff Size: Normal)   Pulse (!) 59   Resp 18   Ht 5\' 10"  (1.778 m)   Wt 187 lb (84.8 kg)   SpO2 100%   BMI 26.83 kg/m   Physical Exam Constitutional:      General: He is not in acute distress.    Appearance: Normal appearance.  HENT:     Head: Normocephalic and atraumatic.  Cardiovascular:     Rate and Rhythm: Normal rate and regular rhythm.     Heart sounds: Normal heart sounds. No murmur heard.    No friction rub. No gallop.  Pulmonary:     Effort: Pulmonary effort is normal. No respiratory distress.     Breath sounds: Normal breath sounds. No wheezing, rhonchi or rales.  Skin:    General: Skin is warm and dry.  Neurological:     Mental Status: He is alert and oriented to person, place, and  time.  Psychiatric:        Mood and Affect: Mood normal.     Assessment & Plan:  Hyperlipidemia associated with type 2 diabetes mellitus (HCC) Assessment & Plan: Last lipid panel: LDL 80, HDL 43, triglycerides 150.  CMP within normal limits.  Continue rosuvastatin 5 mg daily.  Will continue to monitor.  Orders: -     Rosuvastatin Calcium; Take 1 tablet (5 mg total) by mouth daily.  Dispense: 90 tablet; Refill: 1 -     Lipid panel; Future -     Comprehensive metabolic panel; Future  Type 2 diabetes mellitus with other specified complication, without long-term current use of insulin (HCC) Assessment & Plan: A1c 6.5.  Continue metformin 500 mg daily.  Will continue to monitor.  Orders: -     metFORMIN HCl; Take 1 tablet (500 mg total) by mouth daily with breakfast.  Dispense: 90 tablet; Refill: 1 -     Hemoglobin A1c; Future  Nocturia -     PSA; Future  Need for pneumococcal 20-valent conjugate vaccination -     Pneumococcal conjugate vaccine 20-valent    Return in about 4 months (around 12/02/2023) for follow-up for HLD, DM, fasting blood work 1 week before.    Melida Quitter, PA

## 2023-08-01 NOTE — Patient Instructions (Addendum)
Keep up the great work!

## 2023-08-01 NOTE — Assessment & Plan Note (Signed)
Last lipid panel: LDL 80, HDL 43, triglycerides 150.  CMP within normal limits.  Continue rosuvastatin 5 mg daily.  Will continue to monitor.

## 2023-08-01 NOTE — Assessment & Plan Note (Signed)
A1c 6.5.  Continue metformin 500 mg daily.  Will continue to monitor.

## 2023-08-27 ENCOUNTER — Other Ambulatory Visit: Payer: Self-pay | Admitting: Family Medicine

## 2023-08-27 DIAGNOSIS — E1169 Type 2 diabetes mellitus with other specified complication: Secondary | ICD-10-CM

## 2023-08-27 DIAGNOSIS — K200 Eosinophilic esophagitis: Secondary | ICD-10-CM

## 2023-09-26 ENCOUNTER — Other Ambulatory Visit (INDEPENDENT_AMBULATORY_CARE_PROVIDER_SITE_OTHER): Payer: Medicare HMO

## 2023-09-26 ENCOUNTER — Ambulatory Visit: Payer: Medicare HMO | Admitting: Nurse Practitioner

## 2023-09-26 ENCOUNTER — Encounter: Payer: Self-pay | Admitting: Nurse Practitioner

## 2023-09-26 VITALS — BP 116/78 | HR 58 | Ht 70.0 in | Wt 188.0 lb

## 2023-09-26 DIAGNOSIS — R109 Unspecified abdominal pain: Secondary | ICD-10-CM

## 2023-09-26 DIAGNOSIS — Z8719 Personal history of other diseases of the digestive system: Secondary | ICD-10-CM

## 2023-09-26 DIAGNOSIS — Z7984 Long term (current) use of oral hypoglycemic drugs: Secondary | ICD-10-CM

## 2023-09-26 DIAGNOSIS — R112 Nausea with vomiting, unspecified: Secondary | ICD-10-CM

## 2023-09-26 DIAGNOSIS — R1012 Left upper quadrant pain: Secondary | ICD-10-CM

## 2023-09-26 DIAGNOSIS — E119 Type 2 diabetes mellitus without complications: Secondary | ICD-10-CM

## 2023-09-26 LAB — BASIC METABOLIC PANEL
BUN: 17 mg/dL (ref 6–23)
CO2: 28 meq/L (ref 19–32)
Calcium: 8.9 mg/dL (ref 8.4–10.5)
Chloride: 105 meq/L (ref 96–112)
Creatinine, Ser: 1.18 mg/dL (ref 0.40–1.50)
GFR: 62.75 mL/min (ref >60.00)
Glucose, Bld: 96 mg/dL (ref 70–99)
Potassium: 4.4 meq/L (ref 3.5–5.1)
Sodium: 139 meq/L (ref 135–145)

## 2023-09-26 LAB — CBC WITH DIFFERENTIAL/PLATELET
Basophils Absolute: 0 10*3/uL (ref 0.0–0.1)
Basophils Relative: 0.4 % (ref 0.0–3.0)
Eosinophils Absolute: 1.3 10*3/uL — ABNORMAL HIGH (ref 0.0–0.7)
Eosinophils Relative: 15.1 % — ABNORMAL HIGH (ref 0.0–5.0)
HCT: 43.2 % (ref 39.0–52.0)
Hemoglobin: 14.7 g/dL (ref 13.0–17.0)
Lymphocytes Relative: 24 % (ref 12.0–46.0)
Lymphs Abs: 2.1 10*3/uL (ref 0.7–4.0)
MCHC: 34 g/dL (ref 30.0–36.0)
MCV: 89.4 fL (ref 78.0–100.0)
Monocytes Absolute: 0.7 10*3/uL (ref 0.1–1.0)
Monocytes Relative: 7.6 % (ref 3.0–12.0)
Neutro Abs: 4.7 10*3/uL (ref 1.4–7.7)
Neutrophils Relative %: 52.9 % (ref 43.0–77.0)
Platelets: 204 10*3/uL (ref 150.0–400.0)
RBC: 4.83 Mil/uL (ref 4.22–5.81)
RDW: 13.4 % (ref 11.5–15.5)
WBC: 8.8 10*3/uL (ref 4.0–10.5)

## 2023-09-26 NOTE — Progress Notes (Signed)
09/26/2023 Alex Clark [id number] 1950/10/13   CHIEF COMPLAINT: Left-sided abdominal pain  HISTORY OF PRESENT ILLNESS: Alex Clark is a 72 year old male with a past medical history of DM type II, GERD and eosinophilic esophagitis. Remotely known by Dr. Leone Payor.  He presents today for further evaluation regarding left-sided abdominal pain, specifically left mid abdomen which started 1 or 2 months ago.  He initially thought he pulled a muscle and received symptom relief when he raised his arm over his head.  His left sided abdominal pain awakened him from sleep on a few occasions.  Approximately 1 month ago, he ate a cheeseburger and [place] fries and shortly after developed generalized abdominal cramping, he vomited up the food he had just eaten on his way home and when he returned home he vomited 2 more times then felt better.  No hematemesis.  On Thanksgiving day, he ate greasy sausage at the [facility] and had a similar episode of generalized abdominal pain and vomited up the food he had just eaten with symptom relief.  Since then, he continues to have intermittent dull left upper quadrant discomfort which radiates to the left mid and left flank area.  Hears gurgling to the left side of his abdomen if he takes a deep breath and after eating.  Mains on Omeprazole 40 mg daily, no GERD or dysphagia symptoms.  He has intermittent constipation and over the past few months noticed his stools are a bit more narrow.  He sometimes takes Metamucil.  No bloody or black stools.  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  No fevers, night sweats or weight loss.  No known family history of esophageal, gastric, colon, liver or pancreatic cancer.  He underwent an EGD and colonoscopy by Dr. Leone Payor 09/2014, see results below.   EGD  09/04/2014: 1) Fissures and multiple subtle ring-like mucosa changes in entire esophagus - biopsied to see if eosinophilic esophagitis  2) otherwise normal EGD  3) 54 Fr Maloney dilation completed with mild resistance and trace heme (after biopsies)  Colonoscopy 09/04/2014: 1. Mild diverticulosis was noted in the sigmoid colon  2. The examination was otherwise normal - good prep - first screening - 10 Year recall colonoscopy   Surgical [P], esophagus, biopsy - SQUAMOUS MUCOSA WITH DIFFUSE EPITHELIAL HYPERPLASIA, PARAKERATOSIS, SUBEPITHELIAL HYALINIZATION, AND MARKEDLY INCREASED INTRAEPITHELIAL EOSINOPHILS (UP TO 41/HPF). SEE COMMENT. - PAS STAIN NEGATIVE FOR FUNGI (CANDIDA).    Past Medical History:  Diagnosis Date   Diabetes mellitus without complication (HCC)    diet controlled   Diverticulosis    Eosinophilic esophagitis    GERD (gastroesophageal reflux disease)    Past Surgical History:  Procedure Laterality Date   COLONOSCOPY     ESOPHAGOGASTRODUODENOSCOPY     JOINT REPLACEMENT N/A    [employer] 12/13/2020   KNEE ARTHROSCOPY Left 2012   TOTAL HIP ARTHROPLASTY Left 10/16/2019   Procedure: TOTAL HIP ARTHROPLASTY ANTERIOR APPROACH;  Surgeon: Sheral Apley, MD;  Location: [facility];  Service: Orthopedics;  Laterality: Left;   Social History: He is married.  He has 1 son and 1 daughter.  Retired.  Non-smoker.  No alcohol use.  No drug use.   Family History:  family history includes Cancer in his maternal uncle and sister; Heart disease in his mother; Hypertension in his  father and mother.  Allergies  Allergen Reactions   Pantoprazole Sodium Other (See Comments)    Headache      Outpatient Encounter Medications as of 09/26/2023  Medication Sig   metFORMIN (GLUCOPHAGE) 500 MG tablet TAKE 1 TABLET BY MOUTH DAILY WITH BREAKFAST.   omeprazole (PRILOSEC) 40 MG capsule TAKE 1 CAPSULE BY MOUTH DAILY BEFORE BREAKFAST.   rosuvastatin (CRESTOR) 5 MG tablet Take 1 tablet (5 mg  total) by mouth daily.   No facility-administered encounter medications on file as of 09/26/2023.    REVIEW OF SYSTEMS:  Gen: Denies fever, sweats or chills. No weight loss.  CV: Denies chest pain, palpitations or edema. Resp: Denies cough, shortness of breath of hemoptysis.  GI:See HPI. GU: Denies urinary burning, blood in urine, increased urinary frequency or incontinence. MS: Denies joint pain, muscles aches or weakness. Derm: Denies rash, itchiness, skin lesions or unhealing ulcers. Psych: Denies depression, anxiety, memory loss or confusion. Heme: Denies bruising, easy bleeding. Neuro:  Denies headaches, dizziness or paresthesias. Endo:  + DM type II.   PHYSICAL EXAM: BP 116/78 (BP Location: Left Arm, Patient Position: Sitting, Cuff Size: Normal)   Pulse (!) 58   Ht 5\' 10"  (1.778 m)   Wt 188 lb (85.3 kg)   SpO2 98%   BMI 26.98 kg/m  General: 72 year old male in no acute distress. Head: Normocephalic and atraumatic. Eyes:  Sclerae non-icteric, conjunctive pink. Ears: Normal auditory acuity. Mouth: Dentition intact. No ulcers or lesions.  Neck: Supple, no lymphadenopathy or thyromegaly.  Lungs: Clear bilaterally to auscultation without wheezes, crackles or rhonchi. Heart: Regular rate and rhythm. No murmur, rub or gallop appreciated.  Abdomen: Soft, nontender, nondistended. No masses.  No abdominal hernia.  No hepatosplenomegaly. Normoactive bowel sounds x 4 quadrants.  Rectal: Deferred.  Musculoskeletal: Symmetrical with no gross deformities. Skin: Warm and dry. No rash or lesions on visible extremities. Extremities: No edema. Neurological: Alert oriented x 4, no focal deficits.  Psychological:  Alert and cooperative. Normal mood and affect.  ASSESSMENT AND PLAN:  72 year old male with left upper quadrant/left mid abdominal/flank pain for the past 1 to 2 months with associated N/V x 2 separate days -CBC, BMP -CTAP with oral and IV contrast -Avoid fatty/greasy  foods  Bowel pattern, stools more neuro for the past few months.  -Fiber supplement of choice daily -MiraLAX nightly -Incision or earlier colonoscopy, await CTAP results  Colon cancer screening.  Colonoscopy 09/2014 showed diverticulosis, no polyps. -Next screening colonoscopy due 09/2024  History of GERD and eosinophilic esophagitis, asymptomatic -Continue omeprazole 40 mg daily  Diabetes mellitus type 2 on Metformin    CC:  Melida Quitter, PA  GI Attending:  Agree w/ CKS-PA note and plans. I would also consider an EGD with colonoscopy if CT does not point [place] to the cause of the problem.  Iva Boop, MD, [facility]

## 2023-09-26 NOTE — Patient Instructions (Addendum)
Your provider has requested that you go to the basement level for lab work before leaving today. Press "B" on the elevator. The lab is located at the first door on the left as you exit the elevator.  You have been scheduled for a CT scan of the abdomen and pelvis at [facility], 1st floor Radiology. You are scheduled on 09/30/23 at 2:30 pm. You should arrive 15-20 minutes prior to your appointment time for registration. Make sure that your have nothing to eat or drink after 12:30 pm  If you have any questions regarding your exam or if you need to reschedule, you may call [facility] at [phone] between the hours of 8:00 am and 5:00 pm, Monday-Friday.   Miralax- every night as needed  Avoid fatty foods.  Thank you for trusting me with your gastrointestinal care!   Alcide Evener, CRNP

## 2023-09-30 ENCOUNTER — Ambulatory Visit (HOSPITAL_COMMUNITY): Payer: Medicare HMO

## 2023-10-11 ENCOUNTER — Ambulatory Visit (HOSPITAL_COMMUNITY)
Admission: RE | Admit: 2023-10-11 | Discharge: 2023-10-11 | Disposition: A | Discharge status: Home / Self Care | Payer: Medicare HMO | Source: Ambulatory Visit | Attending: Nurse Practitioner | Admitting: Nurse Practitioner

## 2023-10-11 DIAGNOSIS — K4090 Unilateral inguinal hernia, without obstruction or gangrene, not specified as recurrent: Secondary | ICD-10-CM | POA: Diagnosis not present

## 2023-10-11 DIAGNOSIS — R109 Unspecified abdominal pain: Secondary | ICD-10-CM | POA: Insufficient documentation

## 2023-10-11 DIAGNOSIS — K5750 Diverticulosis of both small and large intestine without perforation or abscess without bleeding: Secondary | ICD-10-CM | POA: Diagnosis not present

## 2023-10-11 DIAGNOSIS — K7689 Other specified diseases of liver: Secondary | ICD-10-CM | POA: Diagnosis not present

## 2023-10-11 MED ORDER — IOHEXOL 300 MG/ML  SOLN
30.0000 mL | Freq: Once | INTRAMUSCULAR | Status: AC | PRN
  Administered 2023-10-11: 30 mL via ORAL

## 2023-10-11 MED ORDER — IOHEXOL 300 MG/ML  SOLN
100.0000 mL | Freq: Once | INTRAMUSCULAR | Status: AC | PRN
  Administered 2023-10-11: 100 mL via INTRAVENOUS

## 2023-10-17 ENCOUNTER — Other Ambulatory Visit: Payer: Self-pay

## 2023-10-17 DIAGNOSIS — R109 Unspecified abdominal pain: Secondary | ICD-10-CM

## 2023-10-17 DIAGNOSIS — R112 Nausea with vomiting, unspecified: Secondary | ICD-10-CM

## 2023-10-17 MED ORDER — SUFLAVE 178.7 G PO SOLR: 1.0000 | Freq: Once | ORAL | 0 refills | Status: AC

## 2023-10-25 DIAGNOSIS — K4090 Unilateral inguinal hernia, without obstruction or gangrene, not specified as recurrent: Secondary | ICD-10-CM | POA: Diagnosis not present

## 2023-10-25 DIAGNOSIS — K429 Umbilical hernia without obstruction or gangrene: Secondary | ICD-10-CM | POA: Diagnosis not present

## 2023-11-10 ENCOUNTER — Encounter: Payer: Self-pay | Admitting: Family Medicine

## 2023-11-16 ENCOUNTER — Encounter: Payer: Self-pay | Admitting: Internal Medicine

## 2023-11-23 ENCOUNTER — Telehealth: Payer: Self-pay | Admitting: Physician Assistant

## 2023-11-23 NOTE — Telephone Encounter (Signed)
Patient called this afternoon asking if his procedure was still ago as scheduled for tomorrow.  He has an arrival time of 1230  Patient was advised that his procedure time has not changed, and that he should plan to complete his prep this evening and be here by 1230 tomorrow, and he confirmed he would be here.

## 2023-11-24 ENCOUNTER — Ambulatory Visit: Payer: Medicare HMO | Admitting: Internal Medicine

## 2023-11-24 ENCOUNTER — Encounter: Payer: Self-pay | Admitting: Internal Medicine

## 2023-11-24 VITALS — BP 111/72 | HR 53 | Temp 98.0°F | Resp 10 | Ht 70.0 in | Wt 188.0 lb

## 2023-11-24 DIAGNOSIS — K2980 Duodenitis without bleeding: Secondary | ICD-10-CM

## 2023-11-24 DIAGNOSIS — D122 Benign neoplasm of ascending colon: Secondary | ICD-10-CM

## 2023-11-24 DIAGNOSIS — K2289 Other specified disease of esophagus: Secondary | ICD-10-CM

## 2023-11-24 DIAGNOSIS — K200 Eosinophilic esophagitis: Secondary | ICD-10-CM | POA: Diagnosis not present

## 2023-11-24 DIAGNOSIS — R112 Nausea with vomiting, unspecified: Secondary | ICD-10-CM

## 2023-11-24 DIAGNOSIS — R109 Unspecified abdominal pain: Secondary | ICD-10-CM

## 2023-11-24 DIAGNOSIS — K5730 Diverticulosis of large intestine without perforation or abscess without bleeding: Secondary | ICD-10-CM

## 2023-11-24 DIAGNOSIS — R1012 Left upper quadrant pain: Secondary | ICD-10-CM | POA: Diagnosis not present

## 2023-11-24 DIAGNOSIS — Z1211 Encounter for screening for malignant neoplasm of colon: Secondary | ICD-10-CM

## 2023-11-24 DIAGNOSIS — K317 Polyp of stomach and duodenum: Secondary | ICD-10-CM | POA: Diagnosis not present

## 2023-11-24 DIAGNOSIS — K2950 Unspecified chronic gastritis without bleeding: Secondary | ICD-10-CM | POA: Diagnosis not present

## 2023-11-24 DIAGNOSIS — E119 Type 2 diabetes mellitus without complications: Secondary | ICD-10-CM | POA: Diagnosis not present

## 2023-11-24 MED ORDER — SODIUM CHLORIDE 0.9 % IV SOLN
500.0000 mL | Freq: Once | INTRAVENOUS | Status: DC
Start: 2023-11-24 — End: 2023-11-24

## 2023-11-24 NOTE — Progress Notes (Signed)
 Report to PACU, RN, vss, BBS= Clear.

## 2023-11-24 NOTE — Op Note (Signed)
[facility] Patient Name: Alex Clark Procedure Date: 11/24/2023 12:03 PM MRN: [id number] Endoscopist: Iva Boop , MD, [id number] Age: 73 Referring MD:  Date of Birth: Mar 14, 1951 Gender: Male Account #: [id number] Procedure:                Colonoscopy Indications:              Screening for colorectal malignant neoplasm, Last                            colonoscopy: 2015 Medicines:                Monitored Anesthesia Care Procedure:                Pre-Anesthesia Assessment:                           - Prior to the procedure, a History and Physical                            was performed, and patient medications and                            allergies were reviewed. The patient's tolerance of                            previous anesthesia was also reviewed. The risks                            and benefits of the procedure and the sedation                            options and risks were discussed with the patient.                            All questions were answered, and informed consent                            was obtained. Prior Anticoagulants: The patient has                            taken no anticoagulant or antiplatelet agents. ASA                            Grade Assessment: II - A patient with mild systemic                            disease. After reviewing the risks and benefits,                            the patient was deemed in satisfactory condition to                            undergo the procedure.  After obtaining informed consent, the colonoscope                            was passed under direct vision. Throughout the                            procedure, the patient's blood pressure, pulse, and                            oxygen saturations were monitored continuously. The                            CF [id number] #[id number] was introduced through the anus                            and advanced to the the cecum,  identified by                            appendiceal orifice and ileocecal valve. The                            colonoscopy was performed without difficulty. The                            patient tolerated the procedure well. The bowel                            preparation used was SUFLAVE via split dose                            instruction. The quality of the bowel preparation                            was good. The ileocecal valve, appendiceal orifice,                            and rectum were photographed. Scope In: 1:45:54 PM Scope Out: 1:59:07 PM Scope Withdrawal Time: 0 hours 10 minutes 35 seconds  Total Procedure Duration: 0 hours 13 minutes 13 seconds  Findings:                 The perianal and digital rectal examinations were                            normal. Pertinent negatives include normal prostate                            (size, shape, and consistency).                           A diminutive polyp was found in the ascending                            colon. The polyp was sessile. The polyp was removed  with a cold snare. Resection and retrieval were                            complete. Verification of patient identification                            for the specimen was done. Estimated blood loss was                            minimal.                           Multiple diverticula were found in the sigmoid                            colon, descending colon and transverse colon.                           The exam was otherwise without abnormality on                            direct and retroflexion views. Complications:            No immediate complications. Estimated Blood Loss:     Estimated blood loss was minimal. Impression:               - One diminutive polyp in the ascending colon,                            removed with a cold snare. Resected and retrieved.                           - Diverticulosis in the sigmoid colon, in the                             descending colon and in the transverse colon.                           - The examination was otherwise normal on direct                            and retroflexion views. Recommendation:           - Patient has a contact number available for                            emergencies. The signs and symptoms of potential                            delayed complications were discussed with the                            patient. Return to normal activities tomorrow.                            Written discharge  instructions were provided to the                            patient.                           - Resume previous diet.                           - Continue present medications.                           - Repeat colonoscopy is recommended. The                            colonoscopy date will be determined after pathology                            results from today's exam become available for                            review. Iva Boop, MD 11/24/2023 2:13:25 PM This report has been signed electronically.

## 2023-11-24 NOTE — Patient Instructions (Addendum)
Handouts provided about diverticulosis and polyps.  Resume previous diet.  Continue present medications.  Await pathology results.  Repeat colonoscopy is recommended.  The colonoscopy date will be determined after pathology results for today's exam become available for review.  YOU HAD AN ENDOSCOPIC PROCEDURE TODAY AT THE [facility]:   Refer to the procedure report that was given to you for any specific questions about what was found during the examination.  If the procedure report does not answer your questions, please call your gastroenterologist to clarify.  If you requested that your care partner not be given the details of your procedure findings, then the procedure report has been included in a sealed envelope for you to review at your convenience later.  YOU SHOULD EXPECT: Some feelings of bloating in the abdomen. Passage of more gas than usual.  Walking can help get rid of the air that was put into your GI tract during the procedure and reduce the bloating. If you had a lower endoscopy (such as a colonoscopy or flexible sigmoidoscopy) you may notice spotting of blood in your stool or on the toilet paper. If you underwent a bowel prep for your procedure, you may not have a normal bowel movement for a few days.  Please Note:  You might notice some irritation and congestion in your nose or some drainage.  This is from the oxygen used during your procedure.  There is no need for concern and it should clear up in a day or so.  SYMPTOMS TO REPORT IMMEDIATELY:  Following lower endoscopy (colonoscopy or flexible sigmoidoscopy):  Excessive amounts of blood in the stool  Significant tenderness or worsening of abdominal pains  Swelling of the abdomen that is new, acute  Fever of 100F or higher  Following upper endoscopy (EGD)  Vomiting of blood or coffee ground material  New chest pain or pain under the shoulder blades  Painful or persistently difficult swallowing  New  shortness of breath  Fever of 100F or higher  Black, tarry-looking stools  For urgent or emergent issues, a gastroenterologist can be reached at any hour by calling [phone]. Do not use MyChart messaging for urgent concerns.    DIET:  We do recommend a small meal at first, but then you may proceed to your regular diet.  Drink plenty of fluids but you should avoid alcoholic beverages for 24 hours.  ACTIVITY:  You should plan to take it easy for the rest of today and you should NOT DRIVE or use heavy machinery until tomorrow (because of the sedation medicines used during the test).    FOLLOW UP: Our staff will call the number listed on your records the next business day following your procedure.  We will call around 7:15- 8:00 am to check on you and address any questions or concerns that you may have regarding the information given to you following your procedure. If we do not reach you, we will leave a message.     If any biopsies were taken you will be contacted by phone or by letter within the next 1-3 weeks.  Please call us at [phone] if you have not heard about the biopsies in 3 weeks.    SIGNATURES/CONFIDENTIALITY: You and/or your care partner have signed paperwork which will be entered into your electronic medical record.  These signatures attest to the fact that that the information above on your After Visit Summary has been reviewed and is understood.  Full responsibility of the confidentiality  of this discharge information lies with you and/or your care-partner.I took biopsies of the esophagus, stomach and upper intestine to check for increased allergy cells (eosinophils). Blood count showed an increase of those and you have the history of eosinophilic esophagitis.  One tiny colon polyp found and removed. You also have a condition called diverticulosis - common and not usually a problem. Please read the handout provided.  I will contact you with pathology results and  recommendations.  I appreciate the opportunity to care for you. Iva Boop, MD, [facility]

## 2023-11-24 NOTE — Op Note (Signed)
[facility] Patient Name: Alex Clark Procedure Date: 11/24/2023 12:13 PM MRN: [id number] Endoscopist: Iva Boop , MD, [id number] Age: 73 Referring MD:  Date of Birth: [birth year] Gender: Male Account #: [id number] Procedure:                Upper GI endoscopy Indications:              Abdominal pain in the left upper quadrant,                            Eosinophilic esophagitis, Follow-up of eosinophilic                            esophagitis, Nausea with vomiting Medicines:                Monitored Anesthesia Care Procedure:                Pre-Anesthesia Assessment:                           - Prior to the procedure, a History and Physical                            was performed, and patient medications and                            allergies were reviewed. The patient's tolerance of                            previous anesthesia was also reviewed. The risks                            and benefits of the procedure and the sedation                            options and risks were discussed with the patient.                            All questions were answered, and informed consent                            was obtained. Prior Anticoagulants: The patient has                            taken no anticoagulant or antiplatelet agents. ASA                            Grade Assessment: II - A patient with mild systemic                            disease. After reviewing the risks and benefits,                            the patient was deemed in satisfactory condition to  undergo the procedure.                           After obtaining informed consent, the endoscope was                            passed under direct vision. Throughout the                            procedure, the patient's blood pressure, pulse, and                            oxygen saturations were monitored continuously. The                            GIF HQ190 #[id number] was  introduced through the                            mouth, and advanced to the second part of duodenum.                            The upper GI endoscopy was accomplished without                            difficulty. The patient tolerated the procedure                            well. Scope In: Scope Out: Findings:                 Mucosal changes including longitudinal furrows were                            found in the entire esophagus. Biopsies were                            obtained from the proximal and distal esophagus                            with cold forceps for histology of suspected                            eosinophilic esophagitis. Verification of patient                            identification for the specimen was done. Estimated                            blood loss was minimal.                           Patchy mildly erythematous mucosa was found in the                            gastric antrum. Biopsies were taken with a cold  forceps for histology. Verification of patient                            identification for the specimen was done. Estimated                            blood loss was minimal.                           Patchy erythematous mucosa was found in the                            duodenal bulb. Biopsies were taken with a cold                            forceps for histology. Verification of patient                            identification for the specimen was done. Estimated                            blood loss was minimal.                           Multiple diminutive sessile polyps were found in                            the gastric fundus.                           The exam was otherwise without abnormality.                           The cardia and gastric fundus were normal on                            retroflexion. Complications:            No immediate complications. Estimated Blood Loss:     Estimated blood loss  was minimal. Impression:               - Esophageal mucosal changes consistent with                            eosinophilic esophagitis.                           - Erythematous mucosa in the antrum. Biopsied.                           - Erythematous duodenopathy. Biopsied.                           - Multiple gastric polyps. Tiny and consistent with                            innocent fundic gland polyps.                           -  The examination was otherwise normal.                           - Biopsies were taken with a cold forceps for                            evaluation of eosinophilic esophagitis. Recommendation:           - Patient has a contact number available for                            emergencies. The signs and symptoms of potential                            delayed complications were discussed with the                            patient. Return to normal activities tomorrow.                            Written discharge instructions were provided to the                            patient.                           - Resume previous diet.                           - Continue present medications.                           - See the other procedure note for documentation of                            additional recommendations.                           - Await pathology results. Iva Boop, MD 11/24/2023 2:10:18 PM This report has been signed electronically.

## 2023-11-24 NOTE — Progress Notes (Signed)
 Called to room to assist during endoscopic procedure.  Patient ID and intended procedure confirmed with present staff. Received instructions for my participation in the procedure from the performing physician.

## 2023-11-24 NOTE — Progress Notes (Signed)
Gastroenterology History and Physical   Primary Care Physician:  Melida Quitter, PA   Reason for Procedure:    Encounter Diagnoses  Name Primary?   Nausea and vomiting, unspecified vomiting type Yes   Left sided abdominal pain      Plan:    EGD, colonoscopy     HPI: Alex Clark is a 73 y.o. male w/ hx EOE who was seen 09/2023 with LUQ and nausea and vomiting.  CBC w/ eosinophilia  EGD scheduled and screening colonoscopy scheduled.  CT was done IMPRESSION: 1. Symmetric wall thickening of a nondistended urinary bladder, correlate with urinalysis to exclude cystitis. 2. Left-sided colonic diverticulosis without findings of acute diverticulitis. 3. Fat containing left inguinal and paraumbilical ventral hernias. 4.  Aortic Atherosclerosis (ICD10-I70.0).   Past Medical History:  Diagnosis Date   Diabetes mellitus without complication (HCC)    diet controlled   Diverticulosis    Eosinophilic esophagitis    GERD (gastroesophageal reflux disease)     Past Surgical History:  Procedure Laterality Date   COLONOSCOPY     ESOPHAGOGASTRODUODENOSCOPY     JOINT REPLACEMENT N/A    [employer] 12/13/2020   KNEE ARTHROSCOPY Left 2012   TOTAL HIP ARTHROPLASTY Left 10/16/2019   Procedure: TOTAL HIP ARTHROPLASTY ANTERIOR APPROACH;  Surgeon: Sheral Apley, MD;  Location: [facility];  Service: Orthopedics;  Laterality: Left;    Prior to Admission medications   Medication Sig Start Date End Date Taking? Authorizing Provider  metFORMIN (GLUCOPHAGE) 500 MG tablet TAKE 1 TABLET BY MOUTH DAILY WITH BREAKFAST. 08/29/23  Yes Saralyn Pilar A, PA  omeprazole (PRILOSEC) 40 MG capsule TAKE 1 CAPSULE BY MOUTH DAILY BEFORE BREAKFAST. 08/29/23  Yes Saralyn Pilar A, PA  rosuvastatin (CRESTOR) 5 MG tablet Take 1 tablet (5 mg total) by mouth daily. 08/23/23  Yes Melida Quitter, PA    Current Outpatient Medications  Medication Sig Dispense Refill   metFORMIN (GLUCOPHAGE) 500 MG  tablet TAKE 1 TABLET BY MOUTH DAILY WITH BREAKFAST. 90 tablet 0   omeprazole (PRILOSEC) 40 MG capsule TAKE 1 CAPSULE BY MOUTH DAILY BEFORE BREAKFAST. 90 capsule 0   rosuvastatin (CRESTOR) 5 MG tablet Take 1 tablet (5 mg total) by mouth daily. 90 tablet 1   Current Facility-Administered Medications  Medication Dose Route Frequency Provider Last Rate Last Admin   0.9 %  sodium chloride infusion  500 mL Intravenous Once Iva Boop, MD        Allergies as of 11/24/2023 - Review Complete 11/24/2023  Allergen Reaction Noted   Pantoprazole sodium Other (See Comments) 11/12/2014    Family History  Problem Relation Age of Onset   Heart disease Mother    Hypertension Mother    Hypertension Father    Cancer Sister    Cancer Maternal Uncle    Colon cancer Neg Hx    Rectal cancer Neg Hx    Stomach cancer Neg Hx    Esophageal cancer Neg Hx     Social History   Socioeconomic History   Marital status: Married    Spouse name: Not on file   Number of children: Not on file   Years of education: Not on file   Highest education level: Not on file  Occupational History   Not on file  Tobacco Use   Smoking status: Never    Passive exposure: Never   Smokeless tobacco: Never  Vaping Use   Vaping status: Never Used  Substance and Sexual Activity  Alcohol use: No    Alcohol/week: 0.0 standard drinks of alcohol   Drug use: No   Sexual activity: Yes  Other Topics Concern   Not on file  Social History Narrative   Academic librarian   Divorced- two children- son lives in [place], [place] an daughter lives in [place]- she has two sons.      Does not have a living will or HPOA- does not want life support if futile.   Social Drivers of Corporate investment banker Strain: Low Risk  (02/24/2023)   Overall Financial Resource Strain (CARDIA)    Difficulty of Paying Living Expenses: Not hard at all  Food Insecurity: No Food Insecurity (02/24/2023)   Hunger Vital Sign    Worried About Running Out of Food  in the Last Year: Never true    Ran Out of Food in the Last Year: Never true  Transportation Needs: No Transportation Needs (02/24/2023)   PRAPARE - Administrator, Civil Service (Medical): No    Lack of Transportation (Non-Medical): No  Physical Activity: Sufficiently Active (02/24/2023)   Exercise Vital Sign    Days of Exercise per Week: 5 days    Minutes of Exercise per Session: 30 min  Stress: No Stress Concern Present (02/24/2023)   [employer] of Occupational Health - Occupational Stress Questionnaire    Feeling of Stress : Not at all  Social Connections: Socially Integrated (02/24/2023)   Social Connection and Isolation Panel [NHANES]    Frequency of Communication with Friends and Family: More than three times a week    Frequency of Social Gatherings with Friends and Family: More than three times a week    Attends Religious Services: More than 4 times per year    Active Member of [employer] or Organizations: Yes    Attends Engineer, structural: More than 4 times per year    Marital Status: Married  Catering manager Violence: Not At Risk (02/24/2023)   Humiliation, Afraid, Rape, and Kick questionnaire    Fear of Current or Ex-Partner: No    Emotionally Abused: No    Physically Abused: No    Sexually Abused: No    Review of Systems:  All other review of systems negative except as mentioned in the HPI.  Physical Exam: Vital signs BP 130/69   Pulse (!) 56   Temp 98 F (36.7 C)   Ht 5\' 10"  (1.778 m)   Wt 188 lb (85.3 kg)   SpO2 99%   BMI 26.98 kg/m   General:   Alert,  Well-developed, well-nourished, pleasant and cooperative in NAD Lungs:  Clear throughout to auscultation.   Heart:  Regular rate and rhythm; no murmurs, clicks, rubs,  or gallops. Abdomen:  Soft, nontender and nondistended. Normal bowel sounds.   Neuro/Psych:  Alert and cooperative. Normal mood and affect. A and O x 3   @Lizzeth Meder  Sena Slate, MD, [facility]  [facility]astroenterology [phone] (pager) 11/24/2023 1:28 PM@

## 2023-11-25 ENCOUNTER — Telehealth: Payer: Self-pay | Admitting: *Deleted

## 2023-11-25 NOTE — Telephone Encounter (Signed)
 Attempted post procedure follow up call.  No answer - LVM.

## 2023-11-28 ENCOUNTER — Other Ambulatory Visit: Payer: Medicare HMO

## 2023-11-28 DIAGNOSIS — E1169 Type 2 diabetes mellitus with other specified complication: Secondary | ICD-10-CM

## 2023-11-28 DIAGNOSIS — R351 Nocturia: Secondary | ICD-10-CM

## 2023-11-28 DIAGNOSIS — E785 Hyperlipidemia, unspecified: Secondary | ICD-10-CM | POA: Diagnosis not present

## 2023-11-29 ENCOUNTER — Encounter: Payer: Self-pay | Admitting: Family Medicine

## 2023-11-29 LAB — COMPREHENSIVE METABOLIC PANEL
ALT: 16 [IU]/L (ref 0–44)
AST: 16 [IU]/L (ref 0–40)
Albumin: 4.6 g/dL (ref 3.9–4.9)
Alkaline Phosphatase: 57 [IU]/L (ref 44–121)
BUN/Creatinine Ratio: 12 (ref 10–24)
BUN: 15 mg/dL (ref 8–27)
Bilirubin Total: 0.5 mg/dL (ref 0.0–1.2)
CO2: 22 mmol/L (ref 20–29)
Calcium: 8.7 mg/dL (ref 8.6–10.2)
Chloride: 102 mmol/L (ref 96–106)
Creatinine, Ser: 1.24 mg/dL (ref 0.76–1.27)
Globulin, Total: 2.1 g/dL (ref 1.5–4.5)
Glucose: 119 mg/dL — ABNORMAL HIGH (ref 70–99)
Potassium: 4.1 mmol/L (ref 3.5–5.2)
Sodium: 139 mmol/L (ref 134–144)
Total Protein: 6.7 g/dL (ref 6.0–8.5)
eGFR: 63 mL/min/{1.73_m2} (ref >59)

## 2023-11-29 LAB — LIPID PANEL
Chol/HDL Ratio: 3.7 {ratio} (ref 0.0–5.0)
Cholesterol, Total: 153 mg/dL (ref 100–199)
HDL: 41 mg/dL (ref >39)
LDL Chol Calc (NIH): 83 mg/dL (ref 0–99)
Triglycerides: 166 mg/dL — ABNORMAL HIGH (ref 0–149)
VLDL Cholesterol Cal: 29 mg/dL (ref 5–40)

## 2023-11-29 LAB — HEMOGLOBIN A1C
Est. average glucose Bld gHb Est-mCnc: 148 mg/dL
Hgb A1c MFr Bld: 6.8 % — ABNORMAL HIGH (ref 4.8–5.6)

## 2023-11-29 LAB — PSA: Prostate Specific Ag, Serum: 1 ng/mL (ref 0.0–4.0)

## 2023-11-29 LAB — SURGICAL PATHOLOGY

## 2023-12-06 ENCOUNTER — Ambulatory Visit (INDEPENDENT_AMBULATORY_CARE_PROVIDER_SITE_OTHER): Payer: Medicare HMO | Admitting: Family Medicine

## 2023-12-06 ENCOUNTER — Encounter: Payer: Self-pay | Admitting: Family Medicine

## 2023-12-06 VITALS — BP 121/68 | HR 59 | Ht 70.0 in | Wt 187.0 lb

## 2023-12-06 DIAGNOSIS — Z7984 Long term (current) use of oral hypoglycemic drugs: Secondary | ICD-10-CM

## 2023-12-06 DIAGNOSIS — E785 Hyperlipidemia, unspecified: Secondary | ICD-10-CM | POA: Diagnosis not present

## 2023-12-06 DIAGNOSIS — E1169 Type 2 diabetes mellitus with other specified complication: Secondary | ICD-10-CM

## 2023-12-06 MED ORDER — METFORMIN HCL 500 MG PO TABS: 500.0000 mg | ORAL_TABLET | Freq: Every day | ORAL | 1 refills | Status: AC

## 2023-12-06 MED ORDER — ROSUVASTATIN CALCIUM 5 MG PO TABS
5.0000 mg | ORAL_TABLET | Freq: Every day | ORAL | 1 refills | Status: AC
Start: 2023-12-06 — End: ?

## 2023-12-06 NOTE — Assessment & Plan Note (Signed)
 A1c 6.8, at goal <7.0.  Continue metformin 500 mg daily.  Will continue to monitor.

## 2023-12-06 NOTE — Patient Instructions (Signed)
 It looks like you do need that second Shingles dose, so please have that updated at the pharmacy!

## 2023-12-06 NOTE — Assessment & Plan Note (Signed)
 Last lipid panel: LDL 83, HDL 41, triglycerides 166.  CMP within normal limits.  Continue rosuvastatin 5 mg daily.  Will continue to monitor.

## 2023-12-06 NOTE — Progress Notes (Signed)
   Established Patient Office Visit  Subjective   Patient ID: Alex Clark, male    DOB: 02-Jan-1951  Age: 73 y.o. MRN: [id number]  Chief Complaint  Patient presents with   Hyperlipidemia   Diabetes    HPI Alex Clark is a 73 y.o. male presenting today for follow up of hyperlipidemia, diabetes. Hyperlipidemia: tolerating rosuvastatin 5 mg daily well with no myalgias or significant side effects.  The 10-year ASCVD risk score (Arnett DK, et al., 2019) is: 28.2% Diabetes: denies hypoglycemic events, wounds or sores that are not healing well, increased thirst or urination. Denies vision problems, eye exam scheduled for this upcoming spring. Taking metformin 500 mg daily as prescribed without any side effects. Has been following low carb diet.   Outpatient Medications Prior to Visit  Medication Sig   omeprazole (PRILOSEC) 40 MG capsule TAKE 1 CAPSULE BY MOUTH DAILY BEFORE BREAKFAST.   [DISCONTINUED] metFORMIN (GLUCOPHAGE) 500 MG tablet TAKE 1 TABLET BY MOUTH DAILY WITH BREAKFAST.   [DISCONTINUED] rosuvastatin (CRESTOR) 5 MG tablet Take 1 tablet (5 mg total) by mouth daily.   No facility-administered medications prior to visit.    ROS Negative unless otherwise noted in HPI   Objective:     BP 121/68   Pulse (!) 59   Ht 5\' 10"  (1.778 m)   Wt 187 lb (84.8 kg)   SpO2 99%   BMI 26.83 kg/m   Physical Exam Constitutional:      General: He is not in acute distress.    Appearance: Normal appearance.  HENT:     Head: Normocephalic and atraumatic.  Cardiovascular:     Rate and Rhythm: Normal rate and regular rhythm.     Heart sounds: Normal heart sounds. No murmur heard.    No friction rub. No gallop.  Pulmonary:     Effort: Pulmonary effort is normal. No respiratory distress.     Breath sounds: Normal breath sounds. No wheezing, rhonchi or rales.  Skin:    General: Skin is warm and dry.  Neurological:     Mental Status: He is alert and oriented to person, place, and  time.  Psychiatric:        Mood and Affect: Mood normal.      Assessment & Plan:  Hyperlipidemia associated with type 2 diabetes mellitus (HCC) Assessment & Plan: Last lipid panel: LDL 83, HDL 41, triglycerides 166.  CMP within normal limits.  Continue rosuvastatin 5 mg daily.  Will continue to monitor.  Orders: -     Rosuvastatin Calcium; Take 1 tablet (5 mg total) by mouth daily.  Dispense: 90 tablet; Refill: 1  Type 2 diabetes mellitus with other specified complication, without long-term current use of insulin (HCC) Assessment & Plan: A1c 6.8, at goal <7.0.  Continue metformin 500 mg daily.  Will continue to monitor.  Orders: -     metFORMIN HCl; Take 1 tablet (500 mg total) by mouth daily with breakfast.  Dispense: 90 tablet; Refill: 1  Discussed need for second Shingles vaccine, aware he will need to have this administered at the pharmacy per Medicare guidelines.  Return in about 4 months (around 04/06/2024) for annual physical, fasting labs 1 week before.    Melida Quitter, PA

## 2023-12-07 ENCOUNTER — Telehealth: Payer: Self-pay | Admitting: Internal Medicine

## 2023-12-07 NOTE — Telephone Encounter (Signed)
 Called to review pathology from [facility] and colonoscopy 2/20 Will try again later.

## 2023-12-12 ENCOUNTER — Telehealth: Payer: Self-pay | Admitting: Internal Medicine

## 2023-12-12 NOTE — Telephone Encounter (Signed)
 Patient called back.  See other phone note.  I had not been able to reach him but he called by number back.  I sent him a letter as well that outlines the treatment plan.  He is asymptomatic at this time and comfortable with not investigating further or treating eosinophilic esophagitis.

## 2023-12-12 NOTE — Telephone Encounter (Signed)
 Unable to reach again Letter will besent

## 2024-01-24 LAB — HM DIABETES EYE EXAM

## 2024-03-08 ENCOUNTER — Ambulatory Visit: Payer: Medicare HMO

## 2024-03-08 DIAGNOSIS — Z0000 Encounter for general adult medical examination without abnormal findings: Secondary | ICD-10-CM

## 2024-03-08 NOTE — Progress Notes (Signed)
 Subjective:   Alex Clark is a 73 y.o. who presents for a Medicare Wellness preventive visit.  As a reminder, Annual Wellness Visits don't include a physical exam, and some assessments may be limited, especially if this visit is performed virtually. We may recommend an in-person follow-up visit with your provider if needed.  Visit Complete: Virtual I connected with  Alex Clark on 03/08/24 by a audio enabled telemedicine application and verified that I am speaking with the correct person using two identifiers.  Patient Location: Home  Provider Location: Home Office  I discussed the limitations of evaluation and management by telemedicine. The patient expressed understanding and agreed to proceed.  Vital Signs: Because this visit was a virtual/telehealth visit, some criteria may be missing or patient reported. Any vitals not documented were not able to be obtained and vitals that have been documented are patient reported.  VideoError- Librarian, academic were attempted between this provider and patient, however failed, due to patient having technical difficulties OR patient did not have access to video capability.  We continued and completed visit with audio only.   Persons Participating in Visit: Patient.  AWV Questionnaire: Yes: Patient Medicare AWV questionnaire was completed by the patient on 03/05/2024; I have confirmed that all information answered by patient is correct and no changes since this date.  Cardiac Risk Factors include: advanced age (>51men, >4 women);diabetes mellitus;dyslipidemia;male gender     Objective:     Today's Vitals   There is no height or weight on file to calculate BMI.     03/08/2024    8:10 AM 02/24/2023   10:43 AM 10/09/2019   10:18 AM 07/30/2014    9:02 AM  Advanced Directives  Does Patient Have a Medical Advance Directive? No No No No  Would patient like information on creating a medical advance directive? No -  Patient declined No - Patient declined No - Patient declined     Current Medications (verified) Outpatient Encounter Medications as of 03/08/2024  Medication Sig   metFORMIN  (GLUCOPHAGE ) 500 MG tablet Take 1 tablet (500 mg total) by mouth daily with breakfast.   omeprazole  (PRILOSEC) 40 MG capsule TAKE 1 CAPSULE BY MOUTH DAILY BEFORE BREAKFAST.   rosuvastatin  (CRESTOR ) 5 MG tablet Take 1 tablet (5 mg total) by mouth daily.   No facility-administered encounter medications on file as of 03/08/2024.    Allergies (verified) Pantoprazole  sodium   History: Past Medical History:  Diagnosis Date   Diabetes mellitus without complication (HCC)    diet controlled   Diverticulosis    Eosinophilic esophagitis    GERD (gastroesophageal reflux disease)    Past Surgical History:  Procedure Laterality Date   COLONOSCOPY     ESOPHAGOGASTRODUODENOSCOPY     JOINT REPLACEMENT N/A    [employer] 12/13/2020   KNEE ARTHROSCOPY Left 2012   TOTAL HIP ARTHROPLASTY Left 10/16/2019   Procedure: TOTAL HIP ARTHROPLASTY ANTERIOR APPROACH;  Surgeon: Saundra Curl, MD;  Location: [facility];  Service: Orthopedics;  Laterality: Left;   Family History  Problem Relation Age of Onset   Heart disease Mother    Hypertension Mother    Hypertension Father    Cancer Sister    Cancer Maternal Uncle    Colon cancer Neg Hx    Rectal cancer Neg Hx    Stomach cancer Neg Hx    Esophageal cancer Neg Hx    Social History   Socioeconomic History   Marital status: Married  Spouse name: Not on file   Number of children: Not on file   Years of education: Not on file   Highest education level: 12th grade  Occupational History   Not on file  Tobacco Use   Smoking status: Never    Passive exposure: Never   Smokeless tobacco: Never  Vaping Use   Vaping status: Never Used  Substance and Sexual Activity   Alcohol use: No    Alcohol/week: 0.0 standard drinks of alcohol   Drug use: No   Sexual activity: Yes  Other  Topics Concern   Not on file  Social History Narrative   Academic librarian   Divorced- two children- son lives in [place], [place] an daughter lives in [place]- she has two sons.      Does not have a living will or HPOA- does not want life support if futile.   Social Drivers of Corporate investment banker Strain: Low Risk  (03/08/2024)   Overall Financial Resource Strain (CARDIA)    Difficulty of Paying Living Expenses: Not hard at all  Food Insecurity: No Food Insecurity (03/08/2024)   Hunger Vital Sign    Worried About Running Out of Food in the Last Year: Never true    Ran Out of Food in the Last Year: Never true  Transportation Needs: No Transportation Needs (03/08/2024)   PRAPARE - Administrator, Civil Service (Medical): No    Lack of Transportation (Non-Medical): No  Physical Activity: Sufficiently Active (03/08/2024)   Exercise Vital Sign    Days of Exercise per Week: 7 days    Minutes of Exercise per Session: 30 min  Stress: No Stress Concern Present (03/08/2024)   [employer] of Occupational Health - Occupational Stress Questionnaire    Feeling of Stress : Not at all  Social Connections: Moderately Integrated (03/08/2024)   Social Connection and Isolation Panel [NHANES]    Frequency of Communication with Friends and Family: More than three times a week    Frequency of Social Gatherings with Friends and Family: More than three times a week    Attends Religious Services: More than 4 times per year    Active Member of [employer] or Organizations: No    Attends Engineer, structural: Never    Marital Status: Married    Tobacco Counseling Counseling given: Not Answered    Clinical Intake:  Pre-visit preparation completed: Yes  Pain : No/denies pain     Nutritional Risks: None Diabetes: Yes CBG done?: No Did pt. bring in CBG monitor from home?: No  Lab Results  Component Value Date   HGBA1C 6.8 (H) 11/28/2023   HGBA1C 6.5 (H) 07/27/2023   HGBA1C 6.3 (H)  02/08/2023     How often do you need to have someone help you when you read instructions, pamphlets, or other written materials from your doctor or pharmacy?: 1 - Never  Interpreter Needed?: No  Information entered by :: NAllen LPN   Activities of Daily Living     03/05/2024    9:55 AM  In your present state of health, do you have any difficulty performing the following activities:  Hearing? 0  Vision? 0  Difficulty concentrating or making decisions? 0  Walking or climbing stairs? 0  Dressing or bathing? 0  Doing errands, shopping? 0  Preparing Food and eating ? N  Using the Toilet? N  In the past six months, have you accidently leaked urine? N  Do you have problems with loss  of bowel control? N  Managing your Medications? N  Managing your Finances? N  Housekeeping or managing your Housekeeping? N    Patient Care Team: Noreene Bearded, PA as PCP - General (Family Medicine) Kenney Peacemaker, MD as Consulting Physician (Gastroenterology)  I have updated your Care Teams any recent Medical Services you may have received from other providers in the past year.     Assessment:    This is a routine wellness examination for Alex Clark.  Hearing/Vision screen Hearing Screening - Comments:: Denies hearing issues Vision Screening - Comments:: Regular eye exams, Dr. French Jester   Goals Addressed             This Visit's Progress    Patient Stated       03/08/2024, stay healthy       Depression Screen     03/08/2024    8:11 AM 12/06/2023    8:37 AM 02/24/2023   10:41 AM 11/24/2022    1:56 PM 06/10/2021   11:12 AM 02/25/2021   10:25 AM 12/16/2020   10:12 AM  PHQ 2/9 Scores  PHQ - 2 Score 0 0 0 0 0 0 0  PHQ- 9 Score 0 0   0 0 0    Fall Risk     03/05/2024    9:55 AM 12/06/2023    8:37 AM 02/24/2023   10:42 AM 02/21/2023    1:26 PM 11/24/2022    1:58 PM  Fall Risk   Falls in the past year? 0 0 0 0 0  Number falls in past yr: 0 0 0 0 0  Injury with Fall? 0 0 0 0 0  Risk for fall  due to : Medication side effect No Fall Risks No Fall Risks    Follow up Falls evaluation completed;Falls prevention discussed Falls evaluation completed Falls prevention discussed      MEDICARE RISK AT HOME:  Medicare Risk at Home Any stairs in or around the home?: (Patient-Rptd) Yes If so, are there any without handrails?: (Patient-Rptd) No Home free of loose throw rugs in walkways, pet beds, electrical cords, etc?: (Patient-Rptd) Yes Adequate lighting in your home to reduce risk of falls?: (Patient-Rptd) Yes Life alert?: (Patient-Rptd) No Use of a cane, walker or w/c?: (Patient-Rptd) No Grab bars in the bathroom?: (Patient-Rptd) No Shower chair or bench in shower?: (Patient-Rptd) No Elevated toilet seat or a handicapped toilet?: (Patient-Rptd) No  TIMED UP AND GO:  Was the test performed?  No  Cognitive Function: 6CIT completed        03/08/2024    8:11 AM 02/24/2023   10:43 AM  6CIT Screen  What Year? 0 points 0 points  What month? 0 points 0 points  What time? 0 points 0 points  Count back from 20 0 points 0 points  Months in reverse 0 points 0 points  Repeat phrase 0 points 0 points  Total Score 0 points 0 points    Immunizations Immunization History  Administered Date(s) Administered   Hep B, Unspecified 05/01/1990, 05/29/1990   Influenza, High Dose Seasonal PF 08/23/2019   Influenza, Quadrivalent, Recombinant, Inj, Pf 07/18/2017   Influenza-Unspecified 07/18/2014, 08/23/2019   PFIZER(Purple Top)SARS-COV-2 Vaccination 10/24/2019, 11/14/2019, 07/09/2020   PNEUMOCOCCAL CONJUGATE-20 08/01/2023   Pneumococcal Conjugate-13 05/21/2019   Tdap 11/01/2017   Zoster Recombinant(Shingrix) 08/26/2021   Zoster, Live 04/05/2016    Screening Tests Health Maintenance  Topic Date Due   Zoster Vaccines- Shingrix (2 of 2) 10/21/2021   COVID-19 Vaccine (4 -  2024-25 season) 06/05/2023   Diabetic kidney evaluation - Urine ACR  03/28/2024   FOOT EXAM  03/28/2024   INFLUENZA  VACCINE  05/04/2024   HEMOGLOBIN A1C  05/27/2024   Diabetic kidney evaluation - eGFR measurement  11/27/2024   OPHTHALMOLOGY EXAM  01/23/2025   Medicare Annual Wellness (AWV)  03/08/2025   DTaP/Tdap/Td (2 - Td or Tdap) 11/02/2027   Pneumonia Vaccine 61+ Years old  Completed   Hepatitis C Screening  Completed   HPV VACCINES  Aged Out   Meningococcal B Vaccine  Aged Out   Colonoscopy  Discontinued    Health Maintenance  Health Maintenance Due  Topic Date Due   Zoster Vaccines- Shingrix (2 of 2) 10/21/2021   COVID-19 Vaccine (4 - 2024-25 season) 06/05/2023   Diabetic kidney evaluation - Urine ACR  03/28/2024   Health Maintenance Items Addressed: Declines covid vaccine. Due for second shingles.  Additional Screening:  Vision Screening: Recommended annual ophthalmology exams for early detection of glaucoma and other disorders of the eye. Would you like a referral to an eye doctor? No    Dental Screening: Recommended annual dental exams for proper oral hygiene  Community Resource Referral / Chronic Care Management: CRR required this visit?  No   CCM required this visit?  No   Plan:    I have personally reviewed and noted the following in the patient's chart:   Medical and social history Use of alcohol, tobacco or illicit drugs  Current medications and supplements including opioid prescriptions. Patient is not currently taking opioid prescriptions. Functional ability and status Nutritional status Physical activity Advanced directives List of other physicians Hospitalizations, surgeries, and ER visits in previous 12 months Vitals Screenings to include cognitive, depression, and falls Referrals and appointments  In addition, I have reviewed and discussed with patient certain preventive protocols, quality metrics, and best practice recommendations. A written personalized care plan for preventive services as well as general preventive health recommendations were provided  to patient.   Areatha Beecham, LPN   10/09/1094   After Visit Summary: (MyChart) Due to this being a telephonic visit, the after visit summary with patients personalized plan was offered to patient via MyChart   Notes: Nothing significant to report at this time.

## 2024-03-08 NOTE — Patient Instructions (Signed)
 [name] , Thank you for taking time out of your busy schedule to complete your Annual Wellness Visit with me. I enjoyed our conversation and look forward to speaking with you again next year. I, as well as your care team,  appreciate your ongoing commitment to your health goals. Please review the following plan we discussed and let me know if I can assist you in the future. Your Game plan/ To Do List    Referrals: If you haven't heard from the office you've been referred to, please reach out to them at the phone provided.  N/a Follow up Visits: Next Medicare AWV with our clinical staff: 04/11/2025 at 8:10   Have you seen your provider in the last 6 months (3 months if uncontrolled diabetes)? Yes Next Office Visit with your provider: 04/10/2024 at 8:50  Clinician Recommendations:  Aim for 30 minutes of exercise or brisk walking, 6-8 glasses of water , and 5 servings of fruits and vegetables each day.       This is a list of the screening recommended for you and due dates:  Health Maintenance  Topic Date Due   Zoster (Shingles) Vaccine (2 of 2) 10/21/2021   COVID-19 Vaccine (4 - 2024-25 season) 06/05/2023   Yearly kidney health urinalysis for diabetes  03/28/2024   Complete foot exam   03/28/2024   Flu Shot  05/04/2024   Hemoglobin A1C  05/27/2024   Yearly kidney function blood test for diabetes  11/27/2024   Eye exam for diabetics  01/23/2025   Medicare Annual Wellness Visit  03/08/2025   DTaP/Tdap/Td vaccine (2 - Td or Tdap) 11/02/2027   Pneumonia Vaccine  Completed   Hepatitis C Screening  Completed   HPV Vaccine  Aged Out   Meningitis B Vaccine  Aged Out   Colon Cancer Screening  Discontinued    Advanced directives: (ACP Link)Information on Advanced Care Planning can be found at [place]  Secretary of [facility] Advance Health Care Directives Advance Health Care Directives. [email or web address]  Advance Care Planning is important because it:  [x]  Makes sure you receive the medical care that  is consistent with your values, goals, and preferences  [x]  It provides guidance to your family and loved ones and reduces their decisional burden about whether or not they are making the right decisions based on your wishes.  Follow the link provided in your after visit summary or read over the paperwork we have mailed to you to help you started getting your Advance Directives in place. If you need assistance in completing these, please reach out to us  so that we can help you!  See attachments for Preventive Care and Fall Prevention Tips.

## 2024-04-02 ENCOUNTER — Other Ambulatory Visit: Payer: Self-pay | Admitting: Family Medicine

## 2024-04-02 ENCOUNTER — Other Ambulatory Visit: Payer: Self-pay | Admitting: *Deleted

## 2024-04-02 DIAGNOSIS — K200 Eosinophilic esophagitis: Secondary | ICD-10-CM

## 2024-04-02 DIAGNOSIS — E1169 Type 2 diabetes mellitus with other specified complication: Secondary | ICD-10-CM

## 2024-04-03 ENCOUNTER — Other Ambulatory Visit

## 2024-04-03 DIAGNOSIS — E1169 Type 2 diabetes mellitus with other specified complication: Secondary | ICD-10-CM

## 2024-04-04 ENCOUNTER — Ambulatory Visit: Payer: Self-pay

## 2024-04-04 LAB — COMPREHENSIVE METABOLIC PANEL WITH GFR
ALT: 19 IU/L (ref 0–44)
AST: 20 IU/L (ref 0–40)
Albumin: 4.8 g/dL (ref 3.9–4.9)
Alkaline Phosphatase: 56 IU/L (ref 44–121)
BUN/Creatinine Ratio: 9 — ABNORMAL LOW (ref 10–24)
BUN: 11 mg/dL (ref 8–27)
Bilirubin Total: 0.6 mg/dL (ref 0.0–1.2)
CO2: 22 mmol/L (ref 20–29)
Calcium: 9.4 mg/dL (ref 8.6–10.2)
Chloride: 101 mmol/L (ref 96–106)
Creatinine, Ser: 1.2 mg/dL (ref 0.76–1.27)
Globulin, Total: 2.5 g/dL (ref 1.5–4.5)
Glucose: 150 mg/dL — ABNORMAL HIGH (ref 70–99)
Potassium: 4.2 mmol/L (ref 3.5–5.2)
Sodium: 141 mmol/L (ref 134–144)
Total Protein: 7.3 g/dL (ref 6.0–8.5)
eGFR: 65 mL/min/{1.73_m2} (ref >59)

## 2024-04-04 LAB — CBC WITH DIFFERENTIAL/PLATELET
Basophils Absolute: 0 10*3/uL (ref 0.0–0.2)
Basos: 1 %
EOS (ABSOLUTE): 0.4 10*3/uL (ref 0.0–0.4)
Eos: 4 %
Hematocrit: 48.1 % (ref 37.5–51.0)
Hemoglobin: 15.6 g/dL (ref 13.0–17.7)
Immature Grans (Abs): 0 10*3/uL (ref 0.0–0.1)
Immature Granulocytes: 0 %
Lymphocytes Absolute: 2.3 10*3/uL (ref 0.7–3.1)
Lymphs: 28 %
MCH: 29.6 pg (ref 26.6–33.0)
MCHC: 32.4 g/dL (ref 31.5–35.7)
MCV: 91 fL (ref 79–97)
Monocytes Absolute: 0.6 10*3/uL (ref 0.1–0.9)
Monocytes: 8 %
Neutrophils Absolute: 5 10*3/uL (ref 1.4–7.0)
Neutrophils: 59 %
Platelets: 219 10*3/uL (ref 150–450)
RBC: 5.27 x10E6/uL (ref 4.14–5.80)
RDW: 12.8 % (ref 11.6–15.4)
WBC: 8.4 10*3/uL (ref 3.4–10.8)

## 2024-04-04 LAB — LIPID PANEL
Chol/HDL Ratio: 3.5 ratio (ref 0.0–5.0)
Cholesterol, Total: 158 mg/dL (ref 100–199)
HDL: 45 mg/dL (ref >39)
LDL Chol Calc (NIH): 80 mg/dL (ref 0–99)
Triglycerides: 193 mg/dL — ABNORMAL HIGH (ref 0–149)
VLDL Cholesterol Cal: 33 mg/dL (ref 5–40)

## 2024-04-04 LAB — HEMOGLOBIN A1C
Est. average glucose Bld gHb Est-mCnc: 143 mg/dL
Hgb A1c MFr Bld: 6.6 % — ABNORMAL HIGH (ref 4.8–5.6)

## 2024-04-04 LAB — MICROALBUMIN / CREATININE URINE RATIO
Creatinine, Urine: 14.7 mg/dL
Microalb/Creat Ratio: <20 mg/g{creat} (ref 0–29)
Microalbumin, Urine: <3 ug/mL

## 2024-04-10 ENCOUNTER — Ambulatory Visit (INDEPENDENT_AMBULATORY_CARE_PROVIDER_SITE_OTHER)

## 2024-04-10 VITALS — BP 123/69 | HR 56 | Temp 97.4°F | Ht 70.0 in | Wt 193.0 lb

## 2024-04-10 DIAGNOSIS — K2100 Gastro-esophageal reflux disease with esophagitis, without bleeding: Secondary | ICD-10-CM | POA: Diagnosis not present

## 2024-04-10 DIAGNOSIS — Z7984 Long term (current) use of oral hypoglycemic drugs: Secondary | ICD-10-CM

## 2024-04-10 DIAGNOSIS — E1169 Type 2 diabetes mellitus with other specified complication: Secondary | ICD-10-CM

## 2024-04-10 DIAGNOSIS — E785 Hyperlipidemia, unspecified: Secondary | ICD-10-CM | POA: Diagnosis not present

## 2024-04-10 NOTE — Assessment & Plan Note (Signed)
 Stable and well-controlled.  Continue omeprazole  40 mg daily.

## 2024-04-10 NOTE — Assessment & Plan Note (Signed)
 A1c 6.6, at goal <7.0.  Continue metformin  500 mg daily.  UACR up-to-date.  Diabetic eye exam up-to-date.  Foot exam performed today.  Will continue to monitor.

## 2024-04-10 NOTE — Assessment & Plan Note (Signed)
 Last lipid panel: LDL 80, HDL 45, triglycerides 193.  CMP within normal limits.  Discussed with patient that his triglycerides have taken a jump since the last time they were checked in March.  Patient not open to starting fenofibrate or increasing rosuvastatin  at this time.  Would like to work on limiting his intake of fried foods/high carb/sugar and reassess in a few months.  Continue 5 mg rosuvastatin  daily for now.  Will continue to monitor.

## 2024-04-10 NOTE — Patient Instructions (Signed)
 It was nice to see you today!  As we discussed in clinic:  -You are up-to-date on all of your health screenings  -A1c is stable and improved.  -Your triglycerides (which are the fat we store from sugar) have elevated since the last time we checked it. We can continue to monitor this and control it by limiting fried foods/high carb foods and exercising. If for some reason this level continues to increase, we can discuss adding a medication called Fenofibrate that works to decrease the triglyceride levels.  -I will plan to see you back in 6 months with labs the week before! It was nice to meet you!  If you have any problems before your next visit feel free to message me via MyChart (minor issues or questions) or call the office, otherwise you may reach out to schedule an office visit.  Thank you! Saddie Sacks, PA-C

## 2024-04-10 NOTE — Progress Notes (Signed)
 Complete physical exam  Patient: Alex Clark   DOB: 1951/05/23   73 y.o. Male  MRN: [id number]  Subjective:    Chief Complaint  Patient presents with   Annual Exam    Physical    Alex Clark is a 73 y.o. male who presents today for a complete physical exam. He reports consuming a general diet. He exercises through walking and working outside in his shop He generally feels well. He reports sleeping well. Reports regular bowel movements at home. He does not have additional problems to discuss today.     Most recent fall risk assessment:    04/10/2024    8:59 AM  Fall Risk   Falls in the past year? 0  Risk for fall due to : No Fall Risks  Follow up Falls evaluation completed     Most recent depression screenings:    04/10/2024    8:59 AM 03/08/2024    8:11 AM  PHQ 2/9 Scores  PHQ - 2 Score 0 0  PHQ- 9 Score 0 0    Dental: No current dental problems and Receives regular dental care    Patient Care Team: Gayle Saddie JULIANNA DEVONNA as PCP - General (Physician Assistant) Avram Lupita BRAVO, MD as Consulting Physician (Gastroenterology)   Outpatient Medications Prior to Visit  Medication Sig   metFORMIN  (GLUCOPHAGE ) 500 MG tablet Take 1 tablet (500 mg total) by mouth daily with breakfast.   omeprazole  (PRILOSEC) 40 MG capsule TAKE 1 CAPSULE BY MOUTH DAILY BEFORE BREAKFAST.   rosuvastatin  (CRESTOR ) 5 MG tablet Take 1 tablet (5 mg total) by mouth daily.   No facility-administered medications prior to visit.    ROS  Per HPI      Objective:     BP 123/69   Pulse (!) 56   Temp (!) 97.4 F (36.3 C) (Oral)   Ht 5' 10 (1.778 m)   Wt 193 lb 0.6 oz (87.6 kg)   SpO2 98%   BMI 27.70 kg/m    Physical Exam Constitutional:      General: He is not in acute distress.    Appearance: Normal appearance.  HENT:     Right Ear: Tympanic membrane normal.     Left Ear: Tympanic membrane normal.     Mouth/Throat:     Mouth: Mucous membranes are moist.     Pharynx: Oropharynx  is clear.  Eyes:     Pupils: Pupils are equal, round, and reactive to light.  Cardiovascular:     Rate and Rhythm: Normal rate and regular rhythm.     Pulses:          Dorsalis pedis pulses are 2+ on the right side and 2+ on the left side.     Heart sounds: Normal heart sounds. No murmur heard.    No friction rub. No gallop.  Pulmonary:     Effort: Pulmonary effort is normal. No respiratory distress.     Breath sounds: Normal breath sounds.  Abdominal:     General: Abdomen is flat. Bowel sounds are normal.     Palpations: Abdomen is soft.  Musculoskeletal:        General: No swelling.     Cervical back: Neck supple.  Feet:     Right foot:     Protective Sensation: 10 sites tested.  10 sites sensed.     Left foot:     Protective Sensation: 10 sites tested.  10 sites sensed.  Lymphadenopathy:  Cervical: No cervical adenopathy.  Skin:    General: Skin is warm and dry.  Neurological:     General: No focal deficit present.     Mental Status: He is alert.  Psychiatric:        Mood and Affect: Mood normal.        Behavior: Behavior normal.        Thought Content: Thought content normal.     No results found for any visits on 04/10/24. Last CBC Lab Results  Component Value Date   WBC 8.4 04/03/2024   HGB 15.6 04/03/2024   HCT 48.1 04/03/2024   MCV 91 04/03/2024   MCH 29.6 04/03/2024   RDW 12.8 04/03/2024   PLT 219 04/03/2024   Last metabolic panel Lab Results  Component Value Date   GLUCOSE 150 (H) 04/03/2024   NA 141 04/03/2024   K 4.2 04/03/2024   CL 101 04/03/2024   CO2 22 04/03/2024   BUN 11 04/03/2024   CREATININE 1.20 04/03/2024   EGFR 65 04/03/2024   CALCIUM  9.4 04/03/2024   PROT 7.3 04/03/2024   ALBUMIN 4.8 04/03/2024   LABGLOB 2.5 04/03/2024   AGRATIO 1.8 02/08/2023   BILITOT 0.6 04/03/2024   ALKPHOS 56 04/03/2024   AST 20 04/03/2024   ALT 19 04/03/2024   ANIONGAP 9 10/12/2019   Last lipids Lab Results  Component Value Date   CHOL 158  04/03/2024   HDL 45 04/03/2024   LDLCALC 80 04/03/2024   LDLDIRECT 127.0 05/21/2019   TRIG 193 (H) 04/03/2024   CHOLHDL 3.5 04/03/2024   Last hemoglobin A1c Lab Results  Component Value Date   HGBA1C 6.6 (H) 04/03/2024   Last thyroid  functions Lab Results  Component Value Date   TSH 2.340 07/27/2023        Assessment & Plan:    Routine Health Maintenance and Physical Exam  Health Maintenance  Topic Date Due   Zoster (Shingles) Vaccine (2 of 2) 10/21/2021   COVID-19 Vaccine (4 - 2024-25 season) 06/05/2023   Complete foot exam   03/28/2024   Flu Shot  05/04/2024   Hemoglobin A1C  10/04/2024   Eye exam for diabetics  01/23/2025   Medicare Annual Wellness Visit  03/08/2025   Yearly kidney function blood test for diabetes  04/03/2025   Yearly kidney health urinalysis for diabetes  04/03/2025   DTaP/Tdap/Td vaccine (2 - Td or Tdap) 11/02/2027   Pneumococcal Vaccine for age over 55  Completed   Hepatitis C Screening  Completed   Hepatitis B Vaccine  Aged Out   HPV Vaccine  Aged Out   Meningitis B Vaccine  Aged Out   Colon Cancer Screening  Discontinued    Discussed health benefits of physical activity, and encouraged him to engage in regular exercise appropriate for his age and condition.  Went over all lab results in detail with patient.  Advised him to continue working to eat less fried foods and high sugar foods and exercising more to support healthy lifestyle and lower triglycerides.  Discussed all age-appropriate health screenings with the patient today.  Patient verbalized understanding and was in agreement with the plan.  Type 2 diabetes mellitus with other specified complication, without long-term current use of insulin (HCC) Assessment & Plan: A1c 6.6, at goal <7.0.  Continue metformin  500 mg daily.  UACR up-to-date.  Diabetic eye exam up-to-date.  Foot exam performed today.  Will continue to monitor.   Hyperlipidemia associated with type 2 diabetes mellitus  (HCC) Assessment &  Plan: Last lipid panel: LDL 80, HDL 45, triglycerides 193.  CMP within normal limits.  Discussed with patient that his triglycerides have taken a jump since the last time they were checked in March.  Patient not open to starting fenofibrate or increasing rosuvastatin  at this time.  Would like to work on limiting his intake of fried foods/high carb/sugar and reassess in a few months.  Continue 5 mg rosuvastatin  daily for now.  Will continue to monitor.   Gastroesophageal reflux disease with esophagitis, unspecified whether hemorrhage Assessment & Plan: Stable and well-controlled.  Continue omeprazole  40 mg daily.     Return in about 6 months (around 10/11/2024) for DM, HLD.    Saddie JULIANNA Sacks, PA-C

## 2024-06-20 ENCOUNTER — Ambulatory Visit (INDEPENDENT_AMBULATORY_CARE_PROVIDER_SITE_OTHER)

## 2024-06-20 VITALS — BP 130/77 | HR 70 | Temp 97.6°F | Wt 193.1 lb

## 2024-06-20 DIAGNOSIS — L918 Other hypertrophic disorders of the skin: Secondary | ICD-10-CM | POA: Insufficient documentation

## 2024-06-20 NOTE — Patient Instructions (Signed)
 VISIT SUMMARY: Today, you came in for a referral to a dermatologist for skin lesions on your back. We discussed your current medications and general health maintenance.  YOUR PLAN: -SKIN LESIONS OF BACK: You have skin lesions on your back, including one that might be caused by your backpack straps. We will refer you to [facility] dermatology for further evaluation. If you do not hear from them within a week, please follow up. If there are delays in getting an appointment, consider other dermatology practices.  -TYPE 2 DIABETES MELLITUS: Type 2 diabetes mellitus is a condition where your body does not use insulin properly, leading to high blood sugar levels. Your condition is currently managed with metformin , and no changes are needed at this time.  -HYPERLIPIDEMIA: Hyperlipidemia is a condition where you have high levels of fats (lipids) in your blood. This is currently managed with rosuvastatin , and no changes are needed at this time.  -GENERAL HEALTH MAINTENANCE: You declined the flu vaccination today. Please continue to monitor your health and contact us  if any issues arise before your next scheduled appointment in July.  INSTRUCTIONS: You have a follow-up appointment scheduled in July. If you experience any issues before then, please contact our office. Additionally, if you do not hear from the dermatology office within a week, please follow up with them or consider other dermatology practices.  If you have any problems before your next visit feel free to message me via MyChart (minor issues or questions) or call the office, otherwise you may reach out to schedule an office visit.  Thank you! Saddie Sacks, PA-C

## 2024-06-20 NOTE — Progress Notes (Signed)
   Acute Office Visit  Subjective:     Patient ID: Alex Clark, male    DOB: 21-May-1951, 73 y.o.   MRN: [id number]  Chief Complaint  Patient presents with   Medical Management of Chronic Issues    Referral to Dermatology     HPI  History of Present Illness   Alex Clark is a 73 year old male who presents for a referral to a dermatologist for skin lesions.  Cutaneous lesions - Skin lesions present on the back, including one in the area where backpack straps are located - Concern regarding this lesion and additional dark, irregular appearing lesions on the same side of the back  Dermatology referral coordination - Contacted dermatology office [facility]) and identified an opening next week but was told that he needed a referral from his PCP  - Has the fax number for the referral and is flexible regarding choice of dermatologist at the practice  Medication management - Currently taking rosuvastatin  and metformin  - No need for medication refills at this time      ROS Per HPI     Objective:    BP 130/77   Pulse 70   Temp 97.6 F (36.4 C) (Oral)   Wt 193 lb 1.3 oz (87.6 kg)   SpO2 95%   BMI 27.70 kg/m    Physical Exam  Head: Normocephalic  Skin: Various skin tags and S.K.s presents over chest, shoulders, and back. No rash. No particularly concerning lesions.  Heart: RRR no m/g/r Lungs: Clear bilat to auscultation Neuro: AxOx3, no focal deficit  No results found for any visits on 06/20/24.      Assessment & Plan:    Skin lesions of back Presence of skin lesions on the back, including a raised lesion potentially caused by backpack straps. - Refer to [facility] dermatology for evaluation of skin lesions. - Advise follow-up with dermatology if not contacted within a week. - Consider alternative dermatology practices if appointment delays occur.  Type 2 diabetes mellitus Type 2 diabetes mellitus is currently managed with  metformin .  Hyperlipidemia Hyperlipidemia is currently managed with rosuvastatin .  General Health Maintenance He declined flu vaccination at this time.  Follow-up He has a follow-up appointment scheduled in July. - Advise contact with the office if any issues arise before the scheduled follow-up.      Return if symptoms worsen or fail to improve.  Saddie JULIANNA Sacks, PA-C

## 2024-06-20 NOTE — Assessment & Plan Note (Signed)
 Presence of skin lesions on the back, including a raised lesion potentially caused by backpack straps. - Refer to [facility] dermatology for evaluation of skin lesions. - Advise follow-up with dermatology if not contacted within a week. - Consider alternative dermatology practices if appointment delays occur.

## 2024-07-02 ENCOUNTER — Ambulatory Visit

## 2024-07-19 ENCOUNTER — Other Ambulatory Visit: Payer: Self-pay

## 2024-07-19 DIAGNOSIS — K200 Eosinophilic esophagitis: Secondary | ICD-10-CM

## 2024-10-25 ENCOUNTER — Other Ambulatory Visit: Payer: Self-pay

## 2024-10-25 DIAGNOSIS — K200 Eosinophilic esophagitis: Secondary | ICD-10-CM

## 2025-04-11 ENCOUNTER — Ambulatory Visit
